# Patient Record
Sex: Female | Born: 1985 | Race: White | Hispanic: No | Smoking: Former smoker
Health system: Southern US, Community
[De-identification: ages and names within clinical notes are randomized; demographics above are authoritative.]

## PROBLEM LIST (undated history)

## (undated) DIAGNOSIS — M1990 Unspecified osteoarthritis, unspecified site: Secondary | ICD-10-CM

## (undated) DIAGNOSIS — E876 Hypokalemia: Secondary | ICD-10-CM

## (undated) DIAGNOSIS — F419 Anxiety disorder, unspecified: Secondary | ICD-10-CM

## (undated) DIAGNOSIS — R87629 Unspecified abnormal cytological findings in specimens from vagina: Secondary | ICD-10-CM

## (undated) DIAGNOSIS — N200 Calculus of kidney: Secondary | ICD-10-CM

## (undated) DIAGNOSIS — N390 Urinary tract infection, site not specified: Secondary | ICD-10-CM

## (undated) HISTORY — PX: INCISION AND DRAINAGE ABSCESS ANAL: SUR669

## (undated) HISTORY — PX: TUBAL LIGATION: SHX77

## (undated) HISTORY — PX: DILATION AND CURETTAGE OF UTERUS: SHX78

## (undated) HISTORY — PX: OTHER SURGICAL HISTORY: SHX169

---
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
## 2005-02-10 ENCOUNTER — Emergency Department (HOSPITAL_COMMUNITY): Admission: EM | Admit: 2005-02-10 | Discharge: 2005-02-10 | Payer: Self-pay | Admitting: Emergency Medicine

## 2005-06-03 ENCOUNTER — Inpatient Hospital Stay (HOSPITAL_COMMUNITY): Admission: AD | Admit: 2005-06-03 | Discharge: 2005-06-03 | Payer: Self-pay | Admitting: *Deleted

## 2005-09-16 ENCOUNTER — Emergency Department (HOSPITAL_COMMUNITY): Admission: EM | Admit: 2005-09-16 | Discharge: 2005-09-16 | Payer: Self-pay | Admitting: Family Medicine

## 2006-01-23 ENCOUNTER — Emergency Department (HOSPITAL_COMMUNITY): Admission: EM | Admit: 2006-01-23 | Discharge: 2006-01-23 | Payer: Self-pay | Admitting: Emergency Medicine

## 2006-02-14 ENCOUNTER — Inpatient Hospital Stay (HOSPITAL_COMMUNITY): Admission: AD | Admit: 2006-02-14 | Discharge: 2006-02-15 | Payer: Self-pay | Admitting: Gynecology

## 2006-02-21 ENCOUNTER — Inpatient Hospital Stay (HOSPITAL_COMMUNITY): Admission: AD | Admit: 2006-02-21 | Discharge: 2006-02-21 | Payer: Self-pay | Admitting: Gynecology

## 2006-03-04 ENCOUNTER — Encounter: Payer: Self-pay | Admitting: Emergency Medicine

## 2006-03-05 ENCOUNTER — Inpatient Hospital Stay (HOSPITAL_COMMUNITY): Admission: AD | Admit: 2006-03-05 | Discharge: 2006-03-06 | Payer: Self-pay | Admitting: Obstetrics & Gynecology

## 2006-03-22 ENCOUNTER — Ambulatory Visit (HOSPITAL_COMMUNITY): Admission: RE | Admit: 2006-03-22 | Discharge: 2006-03-22 | Payer: Self-pay | Admitting: Obstetrics and Gynecology

## 2006-03-22 ENCOUNTER — Encounter (INDEPENDENT_AMBULATORY_CARE_PROVIDER_SITE_OTHER): Payer: Self-pay | Admitting: Specialist

## 2006-03-26 ENCOUNTER — Emergency Department (HOSPITAL_COMMUNITY): Admission: EM | Admit: 2006-03-26 | Discharge: 2006-03-26 | Payer: Self-pay | Admitting: Emergency Medicine

## 2006-07-21 ENCOUNTER — Inpatient Hospital Stay (HOSPITAL_COMMUNITY): Admission: AD | Admit: 2006-07-21 | Discharge: 2006-07-21 | Payer: Self-pay | Admitting: Family Medicine

## 2006-09-04 ENCOUNTER — Inpatient Hospital Stay (HOSPITAL_COMMUNITY): Admission: AD | Admit: 2006-09-04 | Discharge: 2006-09-04 | Payer: Self-pay | Admitting: Gynecology

## 2007-01-08 ENCOUNTER — Ambulatory Visit (HOSPITAL_COMMUNITY): Admission: AD | Admit: 2007-01-08 | Discharge: 2007-01-08 | Payer: Self-pay | Admitting: Obstetrics and Gynecology

## 2007-02-03 ENCOUNTER — Ambulatory Visit: Payer: Self-pay | Admitting: Physician Assistant

## 2007-02-03 ENCOUNTER — Observation Stay (HOSPITAL_COMMUNITY): Admission: AD | Admit: 2007-02-03 | Discharge: 2007-02-03 | Payer: Self-pay | Admitting: Gynecology

## 2007-02-15 ENCOUNTER — Encounter (INDEPENDENT_AMBULATORY_CARE_PROVIDER_SITE_OTHER): Payer: Self-pay | Admitting: Gynecology

## 2007-02-15 ENCOUNTER — Inpatient Hospital Stay (HOSPITAL_COMMUNITY): Admission: AD | Admit: 2007-02-15 | Discharge: 2007-02-17 | Payer: Self-pay | Admitting: Obstetrics and Gynecology

## 2007-02-15 ENCOUNTER — Ambulatory Visit: Payer: Self-pay | Admitting: Gynecology

## 2009-04-17 ENCOUNTER — Inpatient Hospital Stay (HOSPITAL_COMMUNITY): Admission: AD | Admit: 2009-04-17 | Discharge: 2009-04-17 | Payer: Self-pay | Admitting: Obstetrics and Gynecology

## 2010-12-03 LAB — CBC
HCT: 41.9 % (ref 36.0–46.0)
MCHC: 34.2 g/dL (ref 30.0–36.0)
MCV: 92.6 fL (ref 78.0–100.0)
RDW: 12.8 % (ref 11.5–15.5)

## 2010-12-03 LAB — WET PREP, GENITAL: Trich, Wet Prep: NONE SEEN

## 2010-12-03 LAB — POCT PREGNANCY, URINE: Preg Test, Ur: NEGATIVE

## 2010-12-03 LAB — URINALYSIS, ROUTINE W REFLEX MICROSCOPIC
Bilirubin Urine: NEGATIVE
Glucose, UA: NEGATIVE mg/dL
Protein, ur: NEGATIVE mg/dL

## 2010-12-03 LAB — GC/CHLAMYDIA PROBE AMP, GENITAL: Chlamydia, DNA Probe: NEGATIVE

## 2011-01-10 NOTE — Group Therapy Note (Signed)
NAMECHINA, Kaitlyn Kramer               ACCOUNT NO.:  [id number]   MEDICAL RECORD NO.:  [id number]          PATIENT TYPE:  OIB   LOCATION:  A415                          FACILITY:  [facility]   PHYSICIAN:  Lazaro Arms, M.D.   DATE OF BIRTH:  1985-09-17   DATE OF PROCEDURE:  01/08/2007  DATE OF DISCHARGE:  01/08/2007                                 PROGRESS NOTE   Kaitlyn Kramer came in on Jan 08, 2007, with complaints of some abdominal pain  and cramps.  She is 33-1/[redacted] weeks gestation.  She was monitored for about  2-1/2 hours.  She was not having any contractions.  Her cervix was fine  and fetal heart rate was reactive; so she was discharged home in stable  condition.      Jacklyn Shell, C.N.M.      Lazaro Arms, M.D.  Electronically Signed    FC/MEDQ  D:  01/10/2007  T:  01/10/2007  Job:  [id number]   cc:   [facility]

## 2011-01-10 NOTE — Op Note (Signed)
NAMEDOT, SPLINTER               ACCOUNT NO.:  [id number]   MEDICAL RECORD NO.:  [id number]          PATIENT TYPE:  INP   LOCATION:  9143                          FACILITY:  [facility]   PHYSICIAN:  Ginger Carne, MD  DATE OF BIRTH:  07/08/1986   DATE OF PROCEDURE:  02/15/2007  DATE OF DISCHARGE:                               OPERATIVE REPORT   PREOPERATIVE DIAGNOSIS:  Retained placenta immediate post delivery.   POSTOPERATIVE DIAGNOSIS:  Retained placenta immediate post delivery.   PROCEDURES:  1. Manual removal of placenta.  2. Repair of second-degree perineal laceration.   SURGEON:  Ginger Carne, MD   ASSISTANT:  None.   COMPLICATIONS:  None immediate.   ESTIMATED BLOOD LOSS:  Minimal.   ANESTHESIA:  General.   OPERATIVE FINDINGS:  The patient had an hourglass-type uterus with  significant contraction, which entrapped a portion of the placenta,  making it difficult to remove manually in labor and delivery without the  benefit of an epidural and once the patient's uterus had relaxed  secondary to nitroglycerin and general anesthesia, an easy manual  removal was performed.  No remnants of tissue noted within the uterine  cavity.  No evidence for accreta was noted.   OPERATIVE PROCEDURE:  The patient prepped and draped in usual fashion  and placed in the lithotomy position.  Betadine solution used for  antiseptic.  The patient was catheterized prior to procedure.  After  adequate general anesthesia and the use of 50 mcg of nitroglycerin  intravenously, manual removal of the placenta was effected.  Careful  exploration followed.  No evidence of accreta or placental remnants  noted.  Afterwards 0.2 mg of Methergine intramuscularly and IV Pitocin  were administered.  Minimal bleeding noted at the end of the procedure.  Repair of the laceration was with 2-0 Vicryl running suture.  The  patient returned to the post anesthesia recovery room in excellent   condition.      Ginger Carne, MD  Electronically Signed     SHB/MEDQ  D:  02/15/2007  T:  02/15/2007  Job:  [id number]

## 2011-01-13 NOTE — H&P (Signed)
NAME:  Kaitlyn Kramer, Kaitlyn Kramer                         ACCOUNT NO.:  [id number]   MEDICAL RECORD NO.:  [id number]                   PATIENT TYPE:  INP   LOCATION:  A417                                 FACILITY:  [facility]   PHYSICIAN:  Jacklyn Shell, C.N.M.    DATE OF BIRTH:  1986/03/26   DATE OF ADMISSION:  12/20/2003  DATE OF DISCHARGE:                                HISTORY & PHYSICAL   HISTORY:  [name] was noted to be fully dilated at +2 station at approximately  10:40.  After a very brief and effective second stage she delivered a viable  female infant at 11:16.  The mouth and nose were suctioned on the perineum  with a bulb syringe, and a loose nuchal cord was reduced.  The body  delivered without difficulty.  Weight is pending, although it looks to be  about 6.5 to 7 pounds, and Apgar's are 9 and 9.  Pitocin 20 units diluted in  1000 cc of lactated ringers was then infused rapidly IV.  The placenta  separated spontaneously, and was delivered by a controlled cord traction and  maternal pushing effort at 11:19.  It was inspected and appears to be intact  with a three vessel cord.  The vagina was inspected and only a small right  labia minora tear was noted and that was repaired under epidural anesthesia  with one stitch just to bring the edges together.  Estimated blood loss was  200 cc.  The epidural catheter was then removed with the blue tip visualized  as being intact.     ___________________________________________                                         Jacklyn Shell, C.N.M.   FC/MEDQ  D:  12/21/2003  T:  12/21/2003  Job:  [id number]   cc:   [facility]

## 2011-01-13 NOTE — H&P (Signed)
Kaitlyn Kramer, Kaitlyn Kramer               ACCOUNT NO.:  [id number]   MEDICAL RECORD NO.:  [id number]          PATIENT TYPE:  AMB   LOCATION:  DAY                           FACILITY:  [facility]   PHYSICIAN:  Tilda Burrow, M.D. DATE OF BIRTH:  [birth year]   DATE OF ADMISSION:  DATE OF DISCHARGE:  LH                                HISTORY & PHYSICAL   ADMISSION DIAGNOSIS:  1.  Missed abortion, [redacted] weeks gestation.  2.  Vulvar abscess, chronic non-healing.  3.  Perineal condyloma.   HISTORY OF PRESENT ILLNESS:  This 25 year old female, gravida 3, para 1, AB  1, with one prior child, is admitted at this time for a vulvar abscess.  She  is admitted at this time for suction D&C for miscarriage diagnosed in our  office.  She was [redacted] weeks gestation when ultrasound showed a 3 mm crown rump  length with absence of fetal heart motion.  She has a large gestational sac  with missed AB explained to patient.  Additionally, she has a non-healing  cystic structure in the left labia minora which appears to be an old  sebaceous cyst which is chronic and non-healing in nature.  She requests  that this be excised at the time.  This dates back as far as six months and  was drained once unsuccessfully.  Additionally, she has multiple condylomata  of the posterior fourchette and a couple at the perianal area.  The plans  are to fulgurate these as part of the procedure.  She understands this  procedure.   PAST MEDICAL HISTORY:  Benign. Impulsive personality.  Migraine headaches.   PAST SURGICAL HISTORY:  Negative other than D&C x1.   MEDICATIONS:  Birth control pills, not recently.   FAMILY HISTORY:  Positive for hypertension, diabetes, breast cancer, and  epilepsy, as well as anxiety.   SOCIAL HISTORY:  Cigarettes rare.  Alcohol denied.  Marijuana denied.   PHYSICAL EXAMINATION:  VITAL SIGNS:  Height 5 foot 7 inches, weight 201, blood pressure 106/68,  pulse 70.  HEENT:  Pupils equal, round, and reactive.  NECK:  Supple, normal thyroid.  CHEST:  Clear to auscultation.  BREASTS:  Exam deferred.  CARDIAC:  Regular rate and rhythm.  ABDOMEN:  Obese without masses or herniae.  PELVIC:  External genitalia multiparous. Vaginal exam normal secretions of  heavy intensity.  Uterus anteflexed,  normal size, shape, and contour with exam made more difficult with obesity.  Perineum is abnormal with the right vulvar abscess, 1.5 cm in diameter by 1  cm depth.  Adnexa nontender without masses.  Uterus anteflexed.   PLAN:  Suction D&C March 22, 2006, with excision of vulvar abscess and  fulguration of condyloma at the same time.      Tilda Burrow, M.D.  Electronically Signed     JVF/MEDQ  D:  03/20/2006  T:  03/20/2006  Job:  [id number]

## 2011-01-13 NOTE — Op Note (Signed)
NAMESEAIRA, BYUS               ACCOUNT NO.:  [id number]   MEDICAL RECORD NO.:  [id number]          PATIENT TYPE:  AMB   LOCATION:  DAY                           FACILITY:  [facility]   PHYSICIAN:  Tilda Burrow, M.D. DATE OF BIRTH:  1985/10/02   DATE OF PROCEDURE:  03/22/2006  DATE OF DISCHARGE:                                 OPERATIVE REPORT   PREOPERATIVE DIAGNOSES:  1.  Missed abortion.  2.  Vulvar abscess.  3.  Vulvar condyloma.  4.  Perianal condyloma.   POSTOPERATIVE DIAGNOSES:  1.  Missed abortion.  2.  Bartholin's abscess.  3.  Vulvar condyloma.  4.  Perianal condyloma.   PROCEDURES:  1.  Suction D&C.  2.  Excision of right Bartholin's abscess.  3.  Fulguration perianal condyloma.  4.  Fulguration perineal condyloma.   SURGEON:  Dr. Emelda Fear.   ASSISTANT:  None.   ANESTHESIA:  General.   COMPLICATIONS:  None.   FINDINGS:  1.  Uterus sounding to 14 cm pre-procedure, 13 cm post-procedure.  2.  Large Bartholin's cyst, extending well up the vaginal wall on the right      side.  3.  Condyloma inside the anal verge, as well as outside of it, multiple      tiny, 5 mm condyloma.   DETAILS OF PROCEDURE:  Patient was taken to the operating room and prepped  and draped for vaginal procedure.  Speculum was inserted.  Cervix grasped.  Uterus sounded to 14 cm, dilated to 31-French, with 9-mm suction curette  used to perform suction curettage.  There was generous amount of bleeding.  IV oxytocin was initiated.  We used a larger suction curette, 10-mm, and  continued curetting and then used smooth sharp curettage to satisfactorily  evacuation the endometrium.  At the end there was a somewhat gritty feel  obtained.  The uterus was diffusely enlarged, however.  At no time was there  suspicion of perforation.   Next was to address the vulvar abscess, which turned out to be a Bartholin's  abscess which had been drained through the perineal body.  An elliptical  incision  was made on the right labia majora, at 8 o'clock position around  the introitus.  The draining abscess site was circumscribed and carved out,  extending approximately 4-5 cm up the vaginal wall.  We did not enter into  the vaginal wall, but reached the base of the abscess, opened it enough to  be sure it would remove the entire abscess, then trimmed it out with  hemostats placed across the vascular pedicles at its apex.  These were tied  down with 2-0 chromic.  Perineal body was then rebuilt with interrupted and  continuous 2-0 chromic.  The skin edges were reapproximated with 3-0 chromic  subcuticular placement.   Vulvar and perianal fulguration of warts was then performed, using cautery  attached to a suction filter and with everybody in the room using  appropriate antiviral masks, we proceeded with fulguration of the perineal  condyloma and the perianal condyloma.  The perianal condyloma could be  palpated inside  the anal verge, and so the tissues were exposed using  traction and countertraction, and cauterization performed superficially to  eliminate the condyloma, both inside the anus as well as over the areas of  the posterior perineal body.  Clitoral area was inspected and no condyloma  noted.  The vaginal area was similarly negative.      Tilda Burrow, M.D.  Electronically Signed     JVF/MEDQ  D:  03/22/2006  T:  03/22/2006  Job:  [id number]   cc:   [facility]

## 2011-01-13 NOTE — H&P (Signed)
NAME:  Kaitlyn Kramer, Kaitlyn Kramer                         ACCOUNT NO.:  [id number]   MEDICAL RECORD NO.:  [id number]                  PATIENT TYPE:   LOCATION:                                       FACILITY:  [facility]   PHYSICIAN:  Tilda Burrow, M.D.              DATE OF BIRTH:  [birth year]   DATE OF ADMISSION:  12/20/2003  DATE OF DISCHARGE:                                HISTORY & PHYSICAL   ADMITTING DIAGNOSES:  1. Pregnancy [redacted] weeks gestation.  2. Elective induction of labor.  3. Cervical favorability.   HPI:  This 25 year old female gravida 1, para 0, LMP May 19, 2003  __________ December 24, 2003 with first and third trimester ultrasounds roughly  correlating.  She is admitted after pregnancy course followed through our  office.  The cervix was 1-2 cm, 50%, -1 with a posteriorly oriented cervix  on her last two office visits.  She has had prodromal symptoms over the past  few days.  She is scheduled for balloon cervical ripening on the 24th and  delivery on the 25th.  She requests epidural for labor management, will use  Dr. Milford Cage, plans to have the baby circumcised.  Will breast feed and bottle  supplement.   PAST MEDICAL HISTORY:  Occasional migraines.   SURGICAL HISTORY:  Negative.   ALLERGIES:  None.   SOCIAL HISTORY:  A 9th grade education.  Plans for GED.  Unemployed.   PHYSICAL EXAMINATION:  Height 5 feet 7 inches; weight 234, which is a 38  pound weight gain; fundal height 38 cm.  Estimated fetal weight 8 pounds.  CERVIX:  Is 1-2 cm, 50%, posterior, -1 station, vertex presentation with  very soft cervix.   Blood type O positive, urine drug screen negative, Rubella immunity present.  Hemoglobin 12, hematocrit 36.  Hepatitis, HIV, GC, Chlamydia, RPR are all  negative.   PLAN:  Balloon dilation on the 24th, Pitocin induction on the 25th.     ___________________________________________                                         Tilda Burrow, M.D.   JVF/MEDQ  D:   12/15/2003  T:  12/15/2003  Job:  [id number]   cc:   Francoise Schaumann. Halm, D.O.  [address]  [place]  [place] [address]  Fax: [phone]

## 2011-01-13 NOTE — Op Note (Signed)
NAME:  Kaitlyn Kramer, BRADT                         ACCOUNT NO.:  [id number]   MEDICAL RECORD NO.:  [id number]                   PATIENT TYPE:  INP   LOCATION:  A403                                 FACILITY:  [facility]   PHYSICIAN:  Lazaro Arms, M.D.                DATE OF BIRTH:  1986-05-08   DATE OF PROCEDURE:  12/21/2003  DATE OF DISCHARGE:  12/23/2003                                 OPERATIVE REPORT   PROCEDURE:  Epidural placement.   INDICATIONS FOR PROCEDURE:  Alanii is an 25 year old gravida 1, para 0 at  term being induced who is having regular contractions and requesting an  epidural to be placed.   DESCRIPTION OF PROCEDURE:  She is placed in the sitting position.  Betadine  prep is used.  Field drape is placed.  A 17-gauge Tuohy needle is used with  loss of resistance technique.  The epidural space is found with one pass  without difficulty.  Bupivacaine 0.125%, 10 cc, is given as a test dose with  an additional 10 cc to dose it up after the catheter is threaded 5 cm into  the epidural space.  It is taped down at this point.  The continuous  infusion is placed at 12 cc an hour.  The patient tolerated the procedure  well.  She had a reassuring fetal heart rate tracing, and her blood pressure  was stable.      ___________________________________________                                            Lazaro Arms, M.D.   LHE/MEDQ  D:  01/05/2004  T:  01/05/2004  Job:  [id number]

## 2011-06-14 LAB — CBC
HCT: 27.6 — ABNORMAL LOW
HCT: 34.6 — ABNORMAL LOW
Hemoglobin: 9.3 — ABNORMAL LOW
MCHC: 33.8
MCHC: 35.1
MCV: 89.6
MCV: 91
Platelets: 177
Platelets: 205
RBC: 3.04 — ABNORMAL LOW
RDW: 13.1
RDW: 13.4
WBC: 10.5
WBC: 15.2 — ABNORMAL HIGH

## 2011-06-14 LAB — TYPE AND SCREEN: Antibody Screen: NEGATIVE

## 2011-06-15 LAB — URINALYSIS, ROUTINE W REFLEX MICROSCOPIC: Ketones, ur: NEGATIVE

## 2011-06-15 LAB — CBC
HCT: 36.3
Hemoglobin: 12.4
MCHC: 34.1
MCV: 89
Platelets: 234
RBC: 4.08
RDW: 13

## 2011-06-15 LAB — RPR: RPR Ser Ql: NONREACTIVE

## 2012-12-01 ENCOUNTER — Encounter (HOSPITAL_COMMUNITY): Payer: Self-pay | Admitting: Emergency Medicine

## 2012-12-01 ENCOUNTER — Emergency Department (HOSPITAL_COMMUNITY)
Admission: EM | Admit: 2012-12-01 | Discharge: 2012-12-02 | Disposition: A | Discharge status: Home / Self Care | Payer: Medicaid Other | Attending: Emergency Medicine | Admitting: Emergency Medicine

## 2012-12-01 DIAGNOSIS — Z79899 Other long term (current) drug therapy: Secondary | ICD-10-CM | POA: Insufficient documentation

## 2012-12-01 DIAGNOSIS — F17200 Nicotine dependence, unspecified, uncomplicated: Secondary | ICD-10-CM | POA: Insufficient documentation

## 2012-12-01 DIAGNOSIS — M129 Arthropathy, unspecified: Secondary | ICD-10-CM | POA: Insufficient documentation

## 2012-12-01 DIAGNOSIS — M26609 Unspecified temporomandibular joint disorder, unspecified side: Secondary | ICD-10-CM | POA: Insufficient documentation

## 2012-12-01 DIAGNOSIS — M26629 Arthralgia of temporomandibular joint, unspecified side: Secondary | ICD-10-CM

## 2012-12-01 HISTORY — DX: Unspecified osteoarthritis, unspecified site: M19.90

## 2012-12-01 MED ORDER — HYDROCODONE-ACETAMINOPHEN 5-325 MG PO TABS
1.0000 | ORAL_TABLET | Freq: Once | ORAL | Status: AC
  Administered 2012-12-01: 1 via ORAL
  Filled 2012-12-01: qty 1

## 2012-12-01 MED ORDER — HYDROCODONE-ACETAMINOPHEN 5-325 MG PO TABS: 1.0000 | ORAL_TABLET | Freq: Four times a day (QID) | ORAL | Status: DC | PRN

## 2012-12-01 MED ORDER — IBUPROFEN 600 MG PO TABS: 600.0000 mg | ORAL_TABLET | Freq: Four times a day (QID) | ORAL | Status: DC | PRN

## 2012-12-01 MED ORDER — IBUPROFEN 400 MG PO TABS
600.0000 mg | ORAL_TABLET | Freq: Once | ORAL | Status: AC
  Administered 2012-12-01: 600 mg via ORAL
  Filled 2012-12-01: qty 1

## 2012-12-01 NOTE — ED Notes (Signed)
Patient said three days ago she started having jaw pain. The patient said it started Friday and it started going away Saturday.  Now, the patient said she cannot eat because she is having pressure and pain to her neck, and ear from her "locked" jaw.  She says she can pop it back in place but it is still painful and she feels the pressure.  Her doctor told her it was fine unless she was experiencing pain.

## 2012-12-01 NOTE — ED Notes (Signed)
C/o R jaw pain since 11am.  States jaw often "pops." States unable to open mouth wide.

## 2012-12-01 NOTE — ED Provider Notes (Signed)
History     CSN: [id number]  Arrival date & time 12/01/12  2228   First MD Initiated Contact with Patient 12/01/12 2319      Chief Complaint  Patient presents with  . Jaw Pain    (Consider location/radiation/quality/duration/timing/severity/associated sxs/prior treatment) HPI Comments: Patient has "click" right side with opening mouth   Has ben going on for a while today took Ibuprofen and tyenol for discomfort without relief  teeth are meeting normally  Denies trauma   The history is provided by the patient.    Past Medical History  Diagnosis Date  . Arthritis     Past Surgical History  Procedure Laterality Date  . Dilation and curettage of uterus    . Tubal ligation    . Incision and drainage abscess anal      No family history on file.  History  Substance Use Topics  . Smoking status: Current Every Day Smoker  . Smokeless tobacco: Not on file  . Alcohol Use: No    OB History   Grav Para Term Preterm Abortions TAB SAB Ect Mult Living                  Review of Systems  Constitutional: Negative for fever, chills, activity change and unexpected weight change.  HENT: Negative for hearing loss, ear pain, sore throat, facial swelling, rhinorrhea and trouble swallowing.   All other systems reviewed and are negative.    Allergies  Review of patient's allergies indicates no known allergies.  Home Medications   Current Outpatient Rx  Name  Route  Sig  Dispense  Refill  . acetaminophen (TYLENOL) 500 MG tablet   Oral   Take 1,000 mg by mouth every 6 (six) hours as needed for pain.         . Aspirin-Acetaminophen-Caffeine (GOODY HEADACHE PO)   Oral   Take 1 packet by mouth daily as needed.         Marland Kitchen ibuprofen (ADVIL,MOTRIN) 200 MG tablet   Oral   Take 400 mg by mouth every 6 (six) hours as needed for pain.         Marland Kitchen loratadine (CLARITIN) 10 MG tablet   Oral   Take 10 mg by mouth daily.         . naproxen sodium (ANAPROX) 220 MG tablet    Oral   Take 220 mg by mouth 2 (two) times daily with a meal.         . phentermine 37.5 MG capsule   Oral   Take 37.5 mg by mouth every morning.         Marland Kitchen HYDROcodone-acetaminophen (NORCO/VICODIN) 5-325 MG per tablet   Oral   Take 1 tablet by mouth every 6 (six) hours as needed for pain.   12 tablet   0   . ibuprofen (ADVIL,MOTRIN) 600 MG tablet   Oral   Take 1 tablet (600 mg total) by mouth every 6 (six) hours as needed for pain.   30 tablet   0     BP 152/111  Pulse 91  Temp(Src) 98.5 F (36.9 C) (Oral)  Resp 16  SpO2 100%  LMP 11/25/2012  Physical Exam  Constitutional: She is oriented to person, place, and time. She appears well-developed and well-nourished.  HENT:  Head: Normocephalic and atraumatic.  Mouth/Throat: Uvula is midline.  Patient has click R TMJ   Eyes: Pupils are equal, round, and reactive to light.  Neck: Normal range of motion. Neck supple.  Cardiovascular: Normal rate.   Pulmonary/Chest: Effort normal.  Neurological: She is alert and oriented to person, place, and time.  Skin: Skin is warm and dry.    ED Course  Procedures (including critical care time)  Labs Reviewed - No data to display No results found.   1. TMJ pain dysfunction syndrome       MDM  TMJ dysfunction will referr to ENT       Arman Filter, NP 12/01/12 2340

## 2012-12-02 NOTE — ED Notes (Signed)
Patient is alert and orientedx4.  Patient was explained discharge instructions and they understood them with no questions.  The patient's friend, Christene Slates, is taking the patient home.

## 2012-12-02 NOTE — ED Provider Notes (Signed)
Medical screening examination/treatment/procedure(s) were performed by non-physician practitioner and as supervising physician I was immediately available for consultation/collaboration.  Kaimana Lurz L Durrel Mcnee, MD 12/02/12 0526 

## 2013-02-14 ENCOUNTER — Encounter (HOSPITAL_COMMUNITY): Payer: Self-pay | Admitting: *Deleted

## 2013-02-14 ENCOUNTER — Emergency Department (HOSPITAL_COMMUNITY)
Admission: EM | Admit: 2013-02-14 | Discharge: 2013-02-15 | Disposition: A | Discharge status: Home / Self Care | Payer: Medicaid Other | Attending: Emergency Medicine | Admitting: Emergency Medicine

## 2013-02-14 DIAGNOSIS — N390 Urinary tract infection, site not specified: Secondary | ICD-10-CM | POA: Insufficient documentation

## 2013-02-14 DIAGNOSIS — Z872 Personal history of diseases of the skin and subcutaneous tissue: Secondary | ICD-10-CM | POA: Insufficient documentation

## 2013-02-14 DIAGNOSIS — R112 Nausea with vomiting, unspecified: Secondary | ICD-10-CM | POA: Insufficient documentation

## 2013-02-14 DIAGNOSIS — K011 Impacted teeth: Secondary | ICD-10-CM

## 2013-02-14 DIAGNOSIS — F17200 Nicotine dependence, unspecified, uncomplicated: Secondary | ICD-10-CM | POA: Insufficient documentation

## 2013-02-14 DIAGNOSIS — K006 Disturbances in tooth eruption: Secondary | ICD-10-CM | POA: Insufficient documentation

## 2013-02-14 DIAGNOSIS — M1990 Unspecified osteoarthritis, unspecified site: Secondary | ICD-10-CM | POA: Insufficient documentation

## 2013-02-14 DIAGNOSIS — R509 Fever, unspecified: Secondary | ICD-10-CM

## 2013-02-14 LAB — CBC WITH DIFFERENTIAL/PLATELET
Basophils Relative: 0 % (ref 0–1)
Eosinophils Relative: 0 % (ref 0–5)
HCT: 41.8 % (ref 36.0–46.0)
Hemoglobin: 14.7 g/dL (ref 12.0–15.0)
Lymphocytes Relative: 15 % (ref 12–46)
Monocytes Relative: 10 % (ref 3–12)
Neutro Abs: 7.9 10*3/uL — ABNORMAL HIGH (ref 1.7–7.7)
WBC: 10.6 10*3/uL — ABNORMAL HIGH (ref 4.0–10.5)

## 2013-02-14 LAB — URINALYSIS, ROUTINE W REFLEX MICROSCOPIC
Bilirubin Urine: NEGATIVE
Glucose, UA: NEGATIVE mg/dL
Ketones, ur: NEGATIVE mg/dL
Nitrite: POSITIVE — AB
Protein, ur: 100 mg/dL — AB
Specific Gravity, Urine: 1.02 (ref 1.005–1.030)
Urobilinogen, UA: 2 mg/dL — ABNORMAL HIGH (ref 0.0–1.0)
pH: 7 (ref 5.0–8.0)

## 2013-02-14 LAB — COMPREHENSIVE METABOLIC PANEL
Alkaline Phosphatase: 104 U/L (ref 39–117)
BUN: 6 mg/dL (ref 6–23)
Chloride: 102 mEq/L (ref 96–112)
GFR calc Af Amer: >90 mL/min (ref >90)
Glucose, Bld: 96 mg/dL (ref 70–99)
Potassium: 3.7 mEq/L (ref 3.5–5.1)
Total Bilirubin: 0.4 mg/dL (ref 0.3–1.2)

## 2013-02-14 LAB — URINE MICROSCOPIC-ADD ON

## 2013-02-14 LAB — PREGNANCY, URINE: Preg Test, Ur: NEGATIVE

## 2013-02-14 LAB — RAPID STREP SCREEN (MED CTR MEBANE ONLY): Streptococcus, Group A Screen (Direct): NEGATIVE

## 2013-02-14 MED ORDER — ONDANSETRON 8 MG PO TBDP: 8.0000 mg | ORAL_TABLET | Freq: Once | ORAL | Status: DC

## 2013-02-14 MED ORDER — CIPROFLOXACIN HCL 500 MG PO TABS
500.0000 mg | ORAL_TABLET | Freq: Two times a day (BID) | ORAL | Status: DC
  Administered 2013-02-14: 500 mg via ORAL
  Filled 2013-02-14: qty 1

## 2013-02-14 MED ORDER — CIPROFLOXACIN HCL 500 MG PO TABS: 500.0000 mg | ORAL_TABLET | Freq: Two times a day (BID) | ORAL | Status: DC

## 2013-02-14 MED ORDER — ACETAMINOPHEN 325 MG PO TABS
650.0000 mg | ORAL_TABLET | Freq: Once | ORAL | Status: AC
  Administered 2013-02-14: 650 mg via ORAL
  Filled 2013-02-14: qty 2

## 2013-02-14 MED ORDER — HYDROCODONE-ACETAMINOPHEN 5-325 MG PO TABS: 2.0000 | ORAL_TABLET | ORAL | Status: DC | PRN

## 2013-02-14 MED ORDER — IBUPROFEN 800 MG PO TABS: 800.0000 mg | ORAL_TABLET | Freq: Three times a day (TID) | ORAL | Status: DC

## 2013-02-14 MED ORDER — ONDANSETRON 4 MG PO TBDP
8.0000 mg | ORAL_TABLET | Freq: Once | ORAL | Status: AC
  Administered 2013-02-14: 8 mg via ORAL
  Filled 2013-02-14: qty 2

## 2013-02-14 NOTE — ED Notes (Signed)
The pt has had a sorehtroat body aches temp  n v for one week. With a toothahce.  lmp today

## 2013-02-15 NOTE — ED Provider Notes (Signed)
History     CSN: [id number]  Arrival date & time 02/14/13  2233   First MD Initiated Contact with Patient 02/14/13 2257      Chief Complaint  Patient presents with  . multiple complaints     (Consider location/radiation/quality/duration/timing/severity/associated sxs/prior treatment) HPI 27-year-old female presents to emergency room with complaint of one day of myalgias, nausea, vomiting, fever and chills, and tooth pain.  Patient told triage it been going on for week, but she and her friend now feel it has only gone on for one day.  She reports dysuria, and back pain, as well.  Patient started her period today.  No treatment prior to arrival Past Medical History  Diagnosis Date  . Arthritis     Past Surgical History  Procedure Laterality Date  . Dilation and curettage of uterus    . Tubal ligation    . Incision and drainage abscess anal      No family history on file.  History  Substance Use Topics  . Smoking status: Current Every Day Smoker  . Smokeless tobacco: Not on file  . Alcohol Use: No    OB History   Grav Para Term Preterm Abortions TAB SAB Ect Mult Living                  Review of Systems  All other systems reviewed and are negative.    Allergies  Review of patient's allergies indicates no known allergies.  Home Medications   Current Outpatient Rx  Name  Route  Sig  Dispense  Refill  . Aspirin-Acetaminophen-Caffeine (GOODY HEADACHE PO)   Oral   Take 1 packet by mouth daily as needed (for pain).          . Ibuprofen (MIDOL) 200 MG CAPS   Oral   Take 200 mg by mouth every 8 (eight) hours as needed (for pain).         . ciprofloxacin (CIPRO) 500 MG tablet   Oral   Take 1 tablet (500 mg total) by mouth 2 (two) times daily.   14 tablet   0   . HYDROcodone-acetaminophen (NORCO/VICODIN) 5-325 MG per tablet   Oral   Take 2 tablets by mouth every 4 (four) hours as needed for pain.   10 tablet   0   . ibuprofen (ADVIL,MOTRIN) 800 MG  tablet   Oral   Take 1 tablet (800 mg total) by mouth 3 (three) times daily.   21 tablet   0   . ondansetron (ZOFRAN-ODT) 8 MG disintegrating tablet   Oral   Take 1 tablet (8 mg total) by mouth once.   20 tablet   0     BP 129/79  Pulse 118  Temp(Src) 102.2 F (39 C) (Oral)  Resp 18  SpO2 99%  LMP 02/14/2013  Physical Exam  Nursing note and vitals reviewed. Constitutional: She is oriented to person, place, and time. She appears well-developed and well-nourished. She appears distressed (uncomfortable-appearing).  HENT:  Head: Normocephalic and atraumatic.  Right Ear: External ear normal.  Left Ear: External ear normal.  Nose: Nose normal.  Mouth/Throat: Oropharynx is clear and moist.  Mild erythema to posterior pharynx.  She has cryptic tonsils.  No exudate noted.  Patient with a merging with some teeth on her lower jaw.  No signs of abscess, erythema.  Eyes: Conjunctivae and EOM are normal. Pupils are equal, round, and reactive to light.  Neck: Normal range of motion. Neck supple. No JVD present.  No tracheal deviation present. No thyromegaly present.  No nuchal rigidity  Cardiovascular: Regular rhythm, normal heart sounds and intact distal pulses.  Exam reveals no gallop and no friction rub.   No murmur heard. Tachycardia noted  Pulmonary/Chest: Effort normal and breath sounds normal. No stridor. No respiratory distress. She has no wheezes. She has no rales. She exhibits no tenderness.  Abdominal: Soft. Bowel sounds are normal. She exhibits no distension and no mass. There is tenderness (mild suprapubic tenderness). There is no rebound and no guarding.  Musculoskeletal: Normal range of motion. She exhibits no edema and no tenderness.  Lymphadenopathy:    She has no cervical adenopathy.  Neurological: She is alert and oriented to person, place, and time. She exhibits normal muscle tone. Coordination normal.  Skin: Skin is warm and dry. No rash noted. No erythema. No pallor.   Psychiatric: She has a normal mood and affect. Her behavior is normal. Judgment and thought content normal.    ED Course  Procedures (including critical care time)  Labs Reviewed  CBC WITH DIFFERENTIAL - Abnormal; Notable for the following:    WBC 10.6 (*)    Neutro Abs 7.9 (*)    Monocytes Absolute 1.1 (*)    All other components within normal limits  COMPREHENSIVE METABOLIC PANEL - Abnormal; Notable for the following:    ALT 79 (*)    All other components within normal limits  URINALYSIS, ROUTINE W REFLEX MICROSCOPIC - Abnormal; Notable for the following:    Color, Urine AMBER (*)    APPearance TURBID (*)    Hgb urine dipstick MODERATE (*)    Protein, ur 100 (*)    Urobilinogen, UA 2.0 (*)    Nitrite POSITIVE (*)    Leukocytes, UA LARGE (*)    All other components within normal limits  URINE MICROSCOPIC-ADD ON - Abnormal; Notable for the following:    Squamous Epithelial / LPF FEW (*)    Bacteria, UA MANY (*)    All other components within normal limits  RAPID STREP SCREEN  URINE CULTURE  CULTURE, GROUP A STREP  PREGNANCY, URINE   No results found.   1. Fever   2. Urinary tract infection   3. Impacted tooth       MDM  27 year old female with fever, urinary tract infection, and wisdom teeth are coming in.  No signs of active infection in the mouth.  Will treat with Cipro for her urinary tract infection.  Patient instructed to drink plenty of fluids and alternate Tylenol and Motrin for fevers.  I've given her followup information for local dentist.        Olivia Mackie, MD 02/15/13 [phone]

## 2013-02-16 ENCOUNTER — Encounter (HOSPITAL_COMMUNITY): Payer: Self-pay | Admitting: Emergency Medicine

## 2013-02-16 ENCOUNTER — Emergency Department (HOSPITAL_COMMUNITY)
Admission: EM | Admit: 2013-02-16 | Discharge: 2013-02-16 | Disposition: A | Discharge status: Home / Self Care | Payer: Medicaid Other | Attending: Emergency Medicine | Admitting: Emergency Medicine

## 2013-02-16 DIAGNOSIS — R509 Fever, unspecified: Secondary | ICD-10-CM | POA: Insufficient documentation

## 2013-02-16 DIAGNOSIS — N390 Urinary tract infection, site not specified: Secondary | ICD-10-CM | POA: Insufficient documentation

## 2013-02-16 DIAGNOSIS — Z79899 Other long term (current) drug therapy: Secondary | ICD-10-CM | POA: Insufficient documentation

## 2013-02-16 DIAGNOSIS — J029 Acute pharyngitis, unspecified: Secondary | ICD-10-CM | POA: Insufficient documentation

## 2013-02-16 DIAGNOSIS — F17200 Nicotine dependence, unspecified, uncomplicated: Secondary | ICD-10-CM | POA: Insufficient documentation

## 2013-02-16 DIAGNOSIS — Z8739 Personal history of other diseases of the musculoskeletal system and connective tissue: Secondary | ICD-10-CM | POA: Insufficient documentation

## 2013-02-16 LAB — CULTURE, GROUP A STREP

## 2013-02-16 NOTE — ED Provider Notes (Signed)
History  This chart was scribed for Shelda Jakes, MD by Ardelia Mems, ED Scribe. This patient was seen in room TR05C/TR05C and the patient's care was started at 6:01 PM.   CSN: [id number]  Arrival date & time 02/16/13  1735      Chief Complaint  Patient presents with  . Sore Throat     The history is provided by the patient. No language interpreter was used.    HPI Comments: Kaitlyn Kramer is a 27 y.o. female who presents to the Emergency Department complaining of constant, moderate sore throat onset about 6 days ago, with an associated fever as high as 1048F. Triage Temp is 99.48F. Pt states that it hurts for her to swallow. Pt was in ED 2 days ago for similar symptoms, was diagnosed with UTI and an impacted tooth. And was given clindamycin and hydrocodone. Pt has taken 3/7 doses of her Clindamycin course. Pt had a strep test 2 days ago which resulted negative. Pt states that she has painful abscesses on her teeth and in her throat, but insists that this is not what she is here for today. Pt is a current every day smoker. Pt denies vomiting, diarrhea, abdominal pain, rash or any other symptoms.  PCP- None  Past Medical History  Diagnosis Date  . Arthritis     Past Surgical History  Procedure Laterality Date  . Dilation and curettage of uterus    . Tubal ligation    . Incision and drainage abscess anal      No family history on file.  History  Substance Use Topics  . Smoking status: Current Every Day Smoker  . Smokeless tobacco: Not on file  . Alcohol Use: No    OB History   Grav Para Term Preterm Abortions TAB SAB Ect Mult Living                  Review of Systems  Constitutional: Positive for fever. Negative for diaphoresis.  HENT: Positive for sore throat. Negative for neck pain and neck stiffness.   Eyes: Negative for visual disturbance.  Respiratory: Negative for apnea, chest tightness and shortness of breath.   Cardiovascular: Negative for chest pain and  palpitations.  Gastrointestinal: Negative for nausea, vomiting, abdominal pain, diarrhea and constipation.  Genitourinary: Negative for dysuria.  Musculoskeletal: Negative for gait problem.  Skin: Negative for rash.  Neurological: Negative for dizziness, weakness, light-headedness, numbness and headaches.   A complete 10 system review of systems was obtained and all systems are negative except as noted in the HPI and PMH.   Allergies  Review of patient's allergies indicates no known allergies.  Home Medications   Current Outpatient Rx  Name  Route  Sig  Dispense  Refill  . Aspirin-Acetaminophen-Caffeine (GOODY HEADACHE PO)   Oral   Take 1 packet by mouth daily as needed (for pain).          . ciprofloxacin (CIPRO) 500 MG tablet   Oral   Take 1 tablet (500 mg total) by mouth 2 (two) times daily.   14 tablet   0   . HYDROcodone-acetaminophen (NORCO/VICODIN) 5-325 MG per tablet   Oral   Take 2 tablets by mouth every 4 (four) hours as needed for pain.   10 tablet   0   . ibuprofen (ADVIL,MOTRIN) 800 MG tablet   Oral   Take 1 tablet (800 mg total) by mouth 3 (three) times daily.   21 tablet   0   .  Ibuprofen (MIDOL) 200 MG CAPS   Oral   Take 200 mg by mouth every 8 (eight) hours as needed (for pain).         . ondansetron (ZOFRAN-ODT) 8 MG disintegrating tablet   Oral   Take 1 tablet (8 mg total) by mouth once.   20 tablet   0     Triage Vitals: BP 131/85  Pulse 98  Temp(Src) 99.2 F (37.3 C) (Oral)  SpO2 99%  LMP 02/14/2013  Physical Exam  Nursing note and vitals reviewed. Constitutional: She is oriented to person, place, and time. She appears well-developed and well-nourished. No distress.  HENT:  Head: Normocephalic and atraumatic. No trismus in the jaw.  Mouth/Throat: No dental abscesses. Posterior oropharyngeal edema and posterior oropharyngeal erythema present. No oropharyngeal exudate or tonsillar abscesses.  Oropharynx is clear. Teeth appear  carious. Erythema and edema of the underlying gingiva. Multiple other teeth have grounds or are previously extracted. Neck is nontender and supple without adenopathy or JVD.  Eyes: Conjunctivae and EOM are normal.  Neck: Normal range of motion. Neck supple.  No meningeal signs  Cardiovascular: Normal rate, regular rhythm and normal heart sounds.  Exam reveals no gallop and no friction rub.   No murmur heard. Pulmonary/Chest: Effort normal and breath sounds normal. No respiratory distress. She has no wheezes. She has no rales. She exhibits no tenderness.  Abdominal: Soft. Bowel sounds are normal. She exhibits no distension. There is no tenderness. There is no rebound and no guarding.  Musculoskeletal: Normal range of motion. She exhibits no edema and no tenderness.  Neurological: She is alert and oriented to person, place, and time. No cranial nerve deficit.  Skin: Skin is warm and dry. She is not diaphoretic. No erythema.  Psychiatric: She has a normal mood and affect.    ED Course  Procedures (including critical care time)  DIAGNOSTIC STUDIES: Oxygen Saturation is 99% on RA, normal by my interpretation.    COORDINATION OF CARE: 6:10 PM- Pt advised of plan for treatment and pt agrees.     Labs Reviewed - No data to display No results found.   1. Viral pharyngitis       MDM  Pt afebrile without tonsillar exudate, negative strep. No cervical lymphadenopathy, no dysphagia; diagnosis of viral pharyngitis. No abx indicated. DC w symptomatic tx for pain  Pt does not appear dehydrated, but did discuss importance of water rehydration. Pt did mention that she was concerned for an abscess in her throat. There is no clinical evidence to support this on physical exam. Pt was able to swallow, drink water in ED with no compromise of airway. No signs of PTA or soft tissue infxn.   No trismus or uvula deviation. Discussed with pt a CT scan of her neck to rule this out. Pt considered, then  declined and requested ENT referral. Included this information with her discharge paperwork along with dental resource guide she states she did not get at her earlier visit. Discussed reasons to seek immediate care. Patient expresses understanding and agrees with plan.   Glade Nurse, PA-C 02/16/13 2200

## 2013-02-16 NOTE — ED Notes (Signed)
Pt here 2 days ago for sore throat. Returns today because "I feel like something's in my throat'. Right tonsil swollen. Temp 99.2

## 2013-02-17 ENCOUNTER — Telehealth (HOSPITAL_COMMUNITY): Payer: Self-pay | Admitting: Emergency Medicine

## 2013-02-17 LAB — URINE CULTURE

## 2013-02-18 ENCOUNTER — Emergency Department (HOSPITAL_COMMUNITY)
Admission: EM | Admit: 2013-02-18 | Discharge: 2013-02-19 | Disposition: A | Discharge status: Home / Self Care | Payer: Medicaid Other | Attending: Emergency Medicine | Admitting: Emergency Medicine

## 2013-02-18 ENCOUNTER — Encounter (HOSPITAL_COMMUNITY): Payer: Self-pay | Admitting: Family Medicine

## 2013-02-18 DIAGNOSIS — R1110 Vomiting, unspecified: Secondary | ICD-10-CM | POA: Insufficient documentation

## 2013-02-18 DIAGNOSIS — M129 Arthropathy, unspecified: Secondary | ICD-10-CM | POA: Insufficient documentation

## 2013-02-18 DIAGNOSIS — J029 Acute pharyngitis, unspecified: Secondary | ICD-10-CM | POA: Insufficient documentation

## 2013-02-18 DIAGNOSIS — Z79899 Other long term (current) drug therapy: Secondary | ICD-10-CM | POA: Insufficient documentation

## 2013-02-18 DIAGNOSIS — Z3202 Encounter for pregnancy test, result negative: Secondary | ICD-10-CM | POA: Insufficient documentation

## 2013-02-18 DIAGNOSIS — F17200 Nicotine dependence, unspecified, uncomplicated: Secondary | ICD-10-CM | POA: Insufficient documentation

## 2013-02-18 LAB — COMPREHENSIVE METABOLIC PANEL
ALT: 42 U/L — ABNORMAL HIGH (ref 0–35)
AST: 22 U/L (ref 0–37)
Alkaline Phosphatase: 94 U/L (ref 39–117)
CO2: 26 mEq/L (ref 19–32)
Chloride: 102 mEq/L (ref 96–112)
GFR calc Af Amer: >90 mL/min (ref >90)
GFR calc non Af Amer: >90 mL/min (ref >90)
Glucose, Bld: 80 mg/dL (ref 70–99)
Potassium: 3.1 mEq/L — ABNORMAL LOW (ref 3.5–5.1)
Sodium: 140 mEq/L (ref 135–145)

## 2013-02-18 LAB — CBC WITH DIFFERENTIAL/PLATELET
Basophils Relative: 1 % (ref 0–1)
Eosinophils Relative: 1 % (ref 0–5)
Hemoglobin: 13.6 g/dL (ref 12.0–15.0)
Lymphocytes Relative: 36 % (ref 12–46)
MCH: 30.8 pg (ref 26.0–34.0)
Monocytes Relative: 9 % (ref 3–12)
Neutrophils Relative %: 53 % (ref 43–77)
Platelets: 194 10*3/uL (ref 150–400)
RBC: 4.42 MIL/uL (ref 3.87–5.11)
WBC: 8.4 10*3/uL (ref 4.0–10.5)

## 2013-02-18 LAB — POCT PREGNANCY, URINE: Preg Test, Ur: NEGATIVE

## 2013-02-18 MED ORDER — GI COCKTAIL ~~LOC~~
30.0000 mL | Freq: Once | ORAL | Status: AC
  Administered 2013-02-18: 30 mL via ORAL
  Filled 2013-02-18: qty 30

## 2013-02-18 MED ORDER — SODIUM CHLORIDE 0.9 % IV BOLUS (SEPSIS)
1000.0000 mL | Freq: Once | INTRAVENOUS | Status: AC
  Administered 2013-02-18: 1000 mL via INTRAVENOUS

## 2013-02-18 MED ORDER — METOCLOPRAMIDE HCL 5 MG/ML IJ SOLN
10.0000 mg | Freq: Once | INTRAMUSCULAR | Status: AC
  Administered 2013-02-18: 10 mg via INTRAVENOUS
  Filled 2013-02-18: qty 2

## 2013-02-18 MED ORDER — MORPHINE SULFATE 4 MG/ML IJ SOLN
4.0000 mg | Freq: Once | INTRAMUSCULAR | Status: AC
  Administered 2013-02-18: 4 mg via INTRAVENOUS
  Filled 2013-02-18: qty 1

## 2013-02-18 NOTE — ED Provider Notes (Signed)
History  This chart was scribed for non-physician practitioner working with Flint Melter, MD by Greggory Stallion, ED scribe. This patient was seen in room WTR2/WLPT2 and the patient's care was started at 9:15 PM.  CSN: [id number] Arrival date & time 02/18/13  2101    Chief Complaint  Patient presents with  . Sore Throat    The history is provided by the patient. No language interpreter was used.    HPI Comments: Kaitlyn Kramer is a 27 y.o. female who presents to the Emergency Department complaining of sore throat that started one week ago. Pt states it burns to drink anything. Pt states she was seen here a few days and diagnosed with a UTI. Pt states she has emesis. She states she is not sure if she has had hematemesis because something red was in her emesis but it hasn't happened since. Pt states she has upper abdominal pain that feels like really bad heartburn. She states her last fever was yesterday and was 100. Pt states she has taken Ibuprofen, Cipro, and Hydrocodone with no relief. Pt states the last time she ate was Saturday. She states she has been trying to drink but it's hard to.  Past Medical History  Diagnosis Date  . Arthritis    Past Surgical History  Procedure Laterality Date  . Dilation and curettage of uterus    . Tubal ligation    . Incision and drainage abscess anal     No family history on file. History  Substance Use Topics  . Smoking status: Current Every Day Smoker  . Smokeless tobacco: Not on file  . Alcohol Use: No   OB History   Grav Para Term Preterm Abortions TAB SAB Ect Mult Living                 Review of Systems  HENT: Positive for sore throat.   Gastrointestinal: Positive for vomiting.  All other systems reviewed and are negative.    Allergies  Review of patient's allergies indicates no known allergies.  Home Medications   Current Outpatient Rx  Name  Route  Sig  Dispense  Refill  . ciprofloxacin (CIPRO) 500 MG tablet   Oral  Take 500 mg by mouth 2 (two) times daily.         Marland Kitchen HYDROcodone-acetaminophen (NORCO) 10-325 MG per tablet   Oral   Take 1 tablet by mouth every 8 (eight) hours as needed for pain.         Marland Kitchen ibuprofen (ADVIL,MOTRIN) 800 MG tablet   Oral   Take 800 mg by mouth every 8 (eight) hours as needed for pain.          BP 128/80  Pulse 105  Temp(Src) 98.9 F (37.2 C) (Oral)  Resp 18  Ht 5\' 7"  (1.702 m)  Wt 204 lb (92.534 kg)  BMI 31.94 kg/m2  SpO2 100%  LMP 02/06/2013  Physical Exam  Nursing note and vitals reviewed. Constitutional: She is oriented to person, place, and time. She appears well-developed and well-nourished. No distress.  HENT:  Head: Normocephalic and atraumatic. No trismus in the jaw.  Right Ear: External ear normal.  Left Ear: External ear normal.  Nose: Nose normal.  Mouth/Throat: Uvula is midline and oropharynx is clear and moist. Mucous membranes are dry.  Mild bilaterally enlarge tonsils - equal and symmetric. No trismus. No tongue elevation. No submental edema.   Eyes: Conjunctivae are normal.  Neck: Normal range of motion.  Cardiovascular: Normal  rate, regular rhythm and normal heart sounds.   Pulmonary/Chest: Effort normal and breath sounds normal. No stridor. No respiratory distress. She has no wheezes. She has no rales.  Abdominal: Soft. She exhibits no distension.  Musculoskeletal: Normal range of motion.  Neurological: She is alert and oriented to person, place, and time. She has normal strength.  Skin: Skin is warm and dry. She is not diaphoretic. No erythema.  Psychiatric: She has a normal mood and affect. Her behavior is normal.    ED Course  Procedures (including critical care time)  DIAGNOSTIC STUDIES: Oxygen Saturation is 100% on RA, normal by my interpretation.    COORDINATION OF CARE: 9:28 PM-Discussed treatment plan with pt at bedside and pt agreed to plan.   Labs Reviewed  COMPREHENSIVE METABOLIC PANEL - Abnormal; Notable for the  following:    Potassium 3.1 (*)    ALT 42 (*)    All other components within normal limits  URINALYSIS, ROUTINE W REFLEX MICROSCOPIC - Abnormal; Notable for the following:    Color, Urine AMBER (*)    APPearance CLOUDY (*)    Hgb urine dipstick SMALL (*)    Bilirubin Urine SMALL (*)    Leukocytes, UA SMALL (*)    All other components within normal limits  URINE MICROSCOPIC-ADD ON - Abnormal; Notable for the following:    Squamous Epithelial / LPF MANY (*)    All other components within normal limits  CBC WITH DIFFERENTIAL  LIPASE, BLOOD  POCT PREGNANCY, URINE   No results found. 1. Pharyngitis     MDM  Patient presents for pharyngitis for the past 6 days. She has been previously evaluated this. Per patient she has been unable to eat or drink anything since Saturday due to vomiting. No concern for impending airway obstruction, peritonsillar abscess, or spread of infection to soft tissue. Mucus membranes dry. She was given a 1L bolus of fluid, morphine, and reglan with mild improvement. GI cocktail with improvement. After GI cocktail she was drinking water, but then began vomiting while being discharged. She needs to pass a PO fluid challenge prior to discharge. Additional reglan dose given. PO fluid challenge. Patient signed out to Earley Favor, NP at shift change.   Medications  sodium chloride 0.9 % bolus 1,000 mL (0 mLs Intravenous Stopped 02/18/13 2351)  morphine 4 MG/ML injection 4 mg (4 mg Intravenous Given 02/18/13 2221)  metoCLOPramide (REGLAN) injection 10 mg (10 mg Intravenous Given 02/18/13 2221)  gi cocktail (Maalox,Lidocaine,Donnatal) (30 mLs Oral Given 02/18/13 2353)  dexamethasone (DECADRON) injection 10 mg (10 mg Intravenous Given 02/19/13 0054)  metoCLOPramide (REGLAN) injection 10 mg (10 mg Intravenous Given 02/19/13 0114)         I personally performed the services described in this documentation, which was scribed in my presence. The recorded information has been  reviewed and is accurate.    Mora Bellman, PA-C 02/19/13 1759

## 2013-02-18 NOTE — ED Notes (Signed)
Patient states she has had a sore throat for a week. States she has pain and burning when she swallows. States she also has a wisdom tooth that is coming in. Has taken Ibuprofen, Cipro, and Hydrocodone without relief of symptoms. Was told by [facility] to follow-up with ENT and ENT can't see her until Thursday.

## 2013-02-18 NOTE — ED Provider Notes (Signed)
Medical screening examination/treatment/procedure(s) were performed by non-physician practitioner and as supervising physician I was immediately available for consultation/collaboration.   Shelda Jakes, MD 02/18/13 [phone]

## 2013-02-19 LAB — URINE MICROSCOPIC-ADD ON

## 2013-02-19 LAB — URINALYSIS, ROUTINE W REFLEX MICROSCOPIC
Glucose, UA: NEGATIVE mg/dL
Protein, ur: NEGATIVE mg/dL
Urobilinogen, UA: 1 mg/dL (ref 0.0–1.0)

## 2013-02-19 MED ORDER — FAMOTIDINE 20 MG PO TABS: 20.0000 mg | ORAL_TABLET | Freq: Two times a day (BID) | ORAL | Status: DC

## 2013-02-19 MED ORDER — METOCLOPRAMIDE HCL 5 MG/ML IJ SOLN
10.0000 mg | Freq: Once | INTRAMUSCULAR | Status: AC
  Administered 2013-02-19: 10 mg via INTRAVENOUS
  Filled 2013-02-19: qty 2

## 2013-02-19 MED ORDER — DEXAMETHASONE SODIUM PHOSPHATE 10 MG/ML IJ SOLN
10.0000 mg | Freq: Once | INTRAMUSCULAR | Status: AC
  Administered 2013-02-19: 10 mg via INTRAVENOUS
  Filled 2013-02-19: qty 1

## 2013-02-19 MED ORDER — OMEPRAZOLE 20 MG PO CPDR: 20.0000 mg | DELAYED_RELEASE_CAPSULE | Freq: Every day | ORAL | Status: DC

## 2013-02-19 NOTE — ED Notes (Signed)
Pt able to keep down water, states she's ready to go home, discharge papers to be given and pt to be sent home.

## 2013-02-20 NOTE — ED Provider Notes (Signed)
Medical screening examination/treatment/procedure(s) were performed by non-physician practitioner and as supervising physician I was immediately available for consultation/collaboration.  Burhan Barham L Lj Miyamoto, MD 02/20/13 0739 

## 2013-09-24 ENCOUNTER — Encounter (HOSPITAL_COMMUNITY): Payer: Self-pay | Admitting: Emergency Medicine

## 2013-09-24 ENCOUNTER — Emergency Department (HOSPITAL_COMMUNITY)
Admission: EM | Admit: 2013-09-24 | Discharge: 2013-09-24 | Disposition: A | Discharge status: Home / Self Care | Payer: Medicaid Other | Attending: Emergency Medicine | Admitting: Emergency Medicine

## 2013-09-24 DIAGNOSIS — K029 Dental caries, unspecified: Secondary | ICD-10-CM | POA: Insufficient documentation

## 2013-09-24 DIAGNOSIS — K0889 Other specified disorders of teeth and supporting structures: Secondary | ICD-10-CM

## 2013-09-24 DIAGNOSIS — K089 Disorder of teeth and supporting structures, unspecified: Secondary | ICD-10-CM | POA: Insufficient documentation

## 2013-09-24 DIAGNOSIS — Z8739 Personal history of other diseases of the musculoskeletal system and connective tissue: Secondary | ICD-10-CM | POA: Insufficient documentation

## 2013-09-24 DIAGNOSIS — F17200 Nicotine dependence, unspecified, uncomplicated: Secondary | ICD-10-CM | POA: Insufficient documentation

## 2013-09-24 MED ORDER — PENICILLIN V POTASSIUM 500 MG PO TABS: 500.0000 mg | ORAL_TABLET | Freq: Four times a day (QID) | ORAL | Status: DC

## 2013-09-24 MED ORDER — TRAMADOL HCL 50 MG PO TABS: 50.0000 mg | ORAL_TABLET | Freq: Four times a day (QID) | ORAL | Status: DC | PRN

## 2013-09-24 NOTE — ED Provider Notes (Signed)
CSN: [id number]     Arrival date & time 09/24/13  1055 History  This chart was scribed for Francee Piccolo, PA-C, working with Shanna Cisco, MD by Blanchard Kelch, ED Scribe. This patient was seen in room TR10C/TR10C and the patient's care was started at 12:08 PM.     Chief Complaint  Patient presents with  . Dental Pain   Patient is a 28 y.o. female presenting with tooth pain. The history is provided by the patient. No language interpreter was used.  Dental Pain Associated symptoms: no congestion and no fever     HPI Comments: GIABELLA DUHART is a 28 y.o. female who presents to the Emergency Department complaining of constant moderate to severe left upper dental pain that began yesterday. She states that she believes it is her wisdom tooth coming in and felt it come through the gums yesterday. She has been taking Motrin and Tylenol without relief. She denies any drainage. She denies fever, chills, sore throat, cough, congestion or rhinorrhea. She does not have a dentist currently.    Past Medical History  Diagnosis Date  . Arthritis    Past Surgical History  Procedure Laterality Date  . Dilation and curettage of uterus    . Tubal ligation    . Incision and drainage abscess anal     No family history on file. History  Substance Use Topics  . Smoking status: Current Every Day Smoker  . Smokeless tobacco: Not on file  . Alcohol Use: No   OB History   Grav Para Term Preterm Abortions TAB SAB Ect Mult Living                 Review of Systems  Constitutional: Negative for fever and chills.  HENT: Positive for dental problem. Negative for congestion, rhinorrhea and sore throat.   Respiratory: Negative for cough.   All other systems reviewed and are negative.    Allergies  Naproxen  Home Medications   Current Outpatient Rx  Name  Route  Sig  Dispense  Refill  . penicillin v potassium (VEETID) 500 MG tablet   Oral   Take 1 tablet (500 mg total) by mouth 4 (four)  times daily.   40 tablet   0   . traMADol (ULTRAM) 50 MG tablet   Oral   Take 1 tablet (50 mg total) by mouth every 6 (six) hours as needed.   15 tablet   0    Triage Vitals: BP 130/66  Pulse 74  Temp(Src) 97.9 F (36.6 C) (Oral)  Resp 16  Ht 5\' 7"  (1.702 m)  Wt 203 lb 7 oz (92.279 kg)  BMI 31.86 kg/m2  SpO2 100%  Physical Exam  Nursing note and vitals reviewed. Constitutional: She is oriented to person, place, and time. She appears well-developed and well-nourished. No distress.  HENT:  Head: Normocephalic and atraumatic.  Right Ear: External ear normal.  Left Ear: External ear normal.  Nose: Nose normal.  Mouth/Throat: Uvula is midline, oropharynx is clear and moist and mucous membranes are normal. No trismus in the jaw. Abnormal dentition. Dental caries present. No uvula swelling. No oropharyngeal exudate.  Eyes: Conjunctivae are normal.  Neck: Normal range of motion. Neck supple.  Cardiovascular: Normal rate, regular rhythm and normal heart sounds.   Pulmonary/Chest: Effort normal and breath sounds normal.  Musculoskeletal: Normal range of motion.  Lymphadenopathy:    She has no cervical adenopathy.  Neurological: She is alert and oriented to person, place, and  time.  Skin: Skin is warm and dry. She is not diaphoretic.  Psychiatric: She has a normal mood and affect.    ED Course  Procedures (including critical care time)  DIAGNOSTIC STUDIES: Oxygen Saturation is 100% on room air, normal by my interpretation.    COORDINATION OF CARE: 12:13 PM -Will discharge with pain medication and resource guide for follow up with dentist. Patient verbalizes understanding and agrees with treatment plan.    Labs Review Labs Reviewed - No data to display Imaging Review No results found.  EKG Interpretation   None       MDM   1. Pain, dental     Filed Vitals:   09/24/13 1108  BP: 130/66  Pulse: 74  Temp: 97.9 F (36.6 C)  Resp: 16    Afebrile, NAD,  non-toxic appearing, AAOx4. Patient with toothache.  No gross abscess.  Exam unconcerning for Ludwig's angina or spread of infection.  Will treat with penicillin and pain medicine.  Urged patient to follow-up with dentist.  Return precautions discussed. Patient is agreeable to plan. Patient is stable at time of discharge     I personally performed the services described in this documentation, which was scribed in my presence. The recorded information has been reviewed and is accurate.    Jeannetta EllisJennifer L Thaison Kolodziejski, PA-C 09/24/13 1351

## 2013-09-24 NOTE — ED Notes (Signed)
Left  Upper tooth pain pt thinks may be wisdom tooth

## 2013-09-24 NOTE — Discharge Instructions (Signed)
Please follow up with your primary care physician in 1-2 days. If you do not have one please call the [facility] number listed above. Please follow up with the dentist to schedule a follow up appointment. Please take pain medication as prescribed and as needed for pain. Please do not drive on narcotic pain medication. Please take your antibiotic until completion. Please read all discharge instructions and return precautions.     Dental Pain A tooth ache may be caused by cavities (tooth decay). Cavities expose the nerve of the tooth to air and hot or cold temperatures. It may come from an infection or abscess (also called a boil or furuncle) around your tooth. It is also often caused by dental caries (tooth decay). This causes the pain you are having. DIAGNOSIS  Your caregiver can diagnose this problem by exam. TREATMENT   If caused by an infection, it may be treated with medications which kill germs (antibiotics) and pain medications as prescribed by your caregiver. Take medications as directed.  Only take over-the-counter or prescription medicines for pain, discomfort, or fever as directed by your caregiver.  Whether the tooth ache today is caused by infection or dental disease, you should see your dentist as soon as possible for further care. SEEK MEDICAL CARE IF: The exam and treatment you received today has been provided on an emergency basis only. This is not a substitute for complete medical or dental care. If your problem worsens or new problems (symptoms) appear, and you are unable to meet with your dentist, call or return to this location. SEEK IMMEDIATE MEDICAL CARE IF:   You have a fever.  You develop redness and swelling of your face, jaw, or neck.  You are unable to open your mouth.  You have severe pain uncontrolled by pain medicine. MAKE SURE YOU:   Understand these instructions.  Will watch your condition.  Will get help right away if you are not  doing well or get worse. Document Released: 08/14/2005 Document Revised: 11/06/2011 Document Reviewed: 04/01/2008 [facility] Patient Information 2014 [place] [place].

## 2013-09-24 NOTE — ED Provider Notes (Signed)
Medical screening examination/treatment/procedure(s) were performed by non-physician practitioner and as supervising physician I was immediately available for consultation/collaboration.  EKG Interpretation   None         Pressley Barsky E Loveta Dellis, MD 09/24/13 2003 

## 2014-05-20 ENCOUNTER — Emergency Department (HOSPITAL_COMMUNITY): Payer: Self-pay

## 2014-05-20 ENCOUNTER — Encounter (HOSPITAL_COMMUNITY): Payer: Self-pay | Admitting: Emergency Medicine

## 2014-05-20 ENCOUNTER — Inpatient Hospital Stay (HOSPITAL_COMMUNITY)
Admission: EM | Admit: 2014-05-20 | Discharge: 2014-05-24 | DRG: 872 | Disposition: A | Discharge status: Home / Self Care | Payer: Self-pay | Attending: Internal Medicine | Admitting: Internal Medicine

## 2014-05-20 ENCOUNTER — Emergency Department (HOSPITAL_COMMUNITY): Payer: Medicaid Other

## 2014-05-20 DIAGNOSIS — R6510 Systemic inflammatory response syndrome (SIRS) of non-infectious origin without acute organ dysfunction: Secondary | ICD-10-CM

## 2014-05-20 DIAGNOSIS — N200 Calculus of kidney: Secondary | ICD-10-CM | POA: Diagnosis present

## 2014-05-20 DIAGNOSIS — R112 Nausea with vomiting, unspecified: Secondary | ICD-10-CM

## 2014-05-20 DIAGNOSIS — N12 Tubulo-interstitial nephritis, not specified as acute or chronic: Secondary | ICD-10-CM

## 2014-05-20 DIAGNOSIS — R21 Rash and other nonspecific skin eruption: Secondary | ICD-10-CM

## 2014-05-20 DIAGNOSIS — N279 Small kidney, unspecified: Secondary | ICD-10-CM | POA: Diagnosis present

## 2014-05-20 DIAGNOSIS — N201 Calculus of ureter: Secondary | ICD-10-CM

## 2014-05-20 DIAGNOSIS — A419 Sepsis, unspecified organism: Principal | ICD-10-CM

## 2014-05-20 DIAGNOSIS — F17200 Nicotine dependence, unspecified, uncomplicated: Secondary | ICD-10-CM | POA: Diagnosis present

## 2014-05-20 DIAGNOSIS — Z792 Long term (current) use of antibiotics: Secondary | ICD-10-CM

## 2014-05-20 DIAGNOSIS — M129 Arthropathy, unspecified: Secondary | ICD-10-CM | POA: Diagnosis present

## 2014-05-20 HISTORY — DX: Urinary tract infection, site not specified: N39.0

## 2014-05-20 LAB — URINE MICROSCOPIC-ADD ON

## 2014-05-20 LAB — BASIC METABOLIC PANEL
Anion gap: 14 (ref 5–15)
BUN: 6 mg/dL (ref 6–23)
CO2: 23 mEq/L (ref 19–32)
CREATININE: 0.72 mg/dL (ref 0.50–1.10)
Calcium: 9.1 mg/dL (ref 8.4–10.5)
Chloride: 99 mEq/L (ref 96–112)
GFR calc Af Amer: >90 mL/min (ref >90)
GLUCOSE: 95 mg/dL (ref 70–99)
POTASSIUM: 4.1 meq/L (ref 3.7–5.3)
Sodium: 136 mEq/L — ABNORMAL LOW (ref 137–147)

## 2014-05-20 LAB — URINALYSIS, ROUTINE W REFLEX MICROSCOPIC
BILIRUBIN URINE: NEGATIVE
Glucose, UA: NEGATIVE mg/dL
NITRITE: NEGATIVE
Protein, ur: NEGATIVE mg/dL
SPECIFIC GRAVITY, URINE: 1.015 (ref 1.005–1.030)
UROBILINOGEN UA: 1 mg/dL (ref 0.0–1.0)
pH: 5.5 (ref 5.0–8.0)

## 2014-05-20 LAB — WET PREP, GENITAL
TRICH WET PREP: NONE SEEN
YEAST WET PREP: NONE SEEN

## 2014-05-20 LAB — CBC WITH DIFFERENTIAL/PLATELET
BASOS PCT: 0 % (ref 0–1)
Basophils Absolute: 0 10*3/uL (ref 0.0–0.1)
EOS ABS: 0 10*3/uL (ref 0.0–0.7)
EOS PCT: 0 % (ref 0–5)
HCT: 39.7 % (ref 36.0–46.0)
HEMOGLOBIN: 13.7 g/dL (ref 12.0–15.0)
LYMPHS ABS: 1.5 10*3/uL (ref 0.7–4.0)
Lymphocytes Relative: 9 % — ABNORMAL LOW (ref 12–46)
MCH: 31.1 pg (ref 26.0–34.0)
MCHC: 34.5 g/dL (ref 30.0–36.0)
MCV: 90 fL (ref 78.0–100.0)
MONOS PCT: 8 % (ref 3–12)
Monocytes Absolute: 1.3 10*3/uL — ABNORMAL HIGH (ref 0.1–1.0)
Neutro Abs: 13.2 10*3/uL — ABNORMAL HIGH (ref 1.7–7.7)
Neutrophils Relative %: 83 % — ABNORMAL HIGH (ref 43–77)
Platelets: 221 10*3/uL (ref 150–400)
RBC: 4.41 MIL/uL (ref 3.87–5.11)
RDW: 12.6 % (ref 11.5–15.5)
WBC: 16 10*3/uL — ABNORMAL HIGH (ref 4.0–10.5)

## 2014-05-20 LAB — I-STAT CG4 LACTIC ACID, ED: LACTIC ACID, VENOUS: 0.47 mmol/L — AB (ref 0.5–2.2)

## 2014-05-20 LAB — POC URINE PREG, ED: PREG TEST UR: NEGATIVE

## 2014-05-20 MED ORDER — DEXTROSE 5 % IV SOLN
1.0000 g | Freq: Every day | INTRAVENOUS | Status: DC
  Filled 2014-05-20: qty 10

## 2014-05-20 MED ORDER — SODIUM CHLORIDE 0.9 % IV SOLN
1000.0000 mL | INTRAVENOUS | Status: DC
Start: 2014-05-20 — End: 2014-05-24
  Administered 2014-05-21: 1000 mL via INTRAVENOUS

## 2014-05-20 MED ORDER — DEXTROSE 5 % IV SOLN
2.0000 g | Freq: Once | INTRAVENOUS | Status: AC
  Administered 2014-05-20: 2 g via INTRAVENOUS
  Filled 2014-05-20: qty 2

## 2014-05-20 MED ORDER — SODIUM CHLORIDE 0.9 % IV BOLUS (SEPSIS)
30.0000 mL/kg | Freq: Once | INTRAVENOUS | Status: AC
  Administered 2014-05-20: 2721 mL via INTRAVENOUS

## 2014-05-20 MED ORDER — MORPHINE SULFATE 4 MG/ML IJ SOLN
4.0000 mg | Freq: Once | INTRAMUSCULAR | Status: AC
  Administered 2014-05-20: 4 mg via INTRAVENOUS
  Filled 2014-05-20: qty 1

## 2014-05-20 MED ORDER — ACETAMINOPHEN 325 MG PO TABS
650.0000 mg | ORAL_TABLET | Freq: Once | ORAL | Status: AC
  Administered 2014-05-20: 650 mg via ORAL
  Filled 2014-05-20: qty 2

## 2014-05-20 MED ORDER — ONDANSETRON HCL 4 MG/2ML IJ SOLN
4.0000 mg | Freq: Once | INTRAMUSCULAR | Status: AC
  Administered 2014-05-20: 4 mg via INTRAVENOUS
  Filled 2014-05-20: qty 2

## 2014-05-20 NOTE — ED Provider Notes (Signed)
CSN: [id number]     Arrival date & time 05/20/14  1906 History   First MD Initiated Contact with Patient 05/20/14 2250     Chief Complaint  Patient presents with  . Flank Pain     (Consider location/radiation/quality/duration/timing/severity/associated sxs/prior Treatment) HPI Comments: This is a G6 P4 0 2 2   28  year old female past medical history significant for tobacco abuse presenting to the emergency department for acute onset suprapubic pain that has been progressively worsening. She states she initially had pain and pressure while voiding, her pain has become more constant. She has had nausea with 2 episodes of nonbloody nonbilious emesis this morning. No alleviating or aggravating factors. Patient has not taken any antipyretics at home. She is also complaining of green vaginal discharge. She does endorse recent unprotected intercourse, with her husband. LMP first week of September. Patient has also had a rash since August after camping. She denies any tick bites at that time, and that she spent prolonged amounts of time outside of the RV.   Patient is a 28 y.o. female presenting with flank pain.  Flank Pain Associated symptoms include abdominal pain, chills, a fever, nausea and vomiting.    Past Medical History  Diagnosis Date  . Arthritis    Past Surgical History  Procedure Laterality Date  . Dilation and curettage of uterus    . Tubal ligation    . Incision and drainage abscess anal     History reviewed. No pertinent family history. History  Substance Use Topics  . Smoking status: Current Every Day Smoker  . Smokeless tobacco: Not on file  . Alcohol Use: No   OB History   Grav Para Term Preterm Abortions TAB SAB Ect Mult Living                 Review of Systems  Constitutional: Positive for fever and chills.  Gastrointestinal: Positive for nausea, vomiting and abdominal pain. Negative for diarrhea and constipation.  Genitourinary: Positive for dysuria and flank  pain.  All other systems reviewed and are negative.     Allergies  Naproxen  Home Medications   Prior to Admission medications   Medication Sig Start Date End Date Taking? Authorizing Provider  miconazole nitrate (MONISTAT 3 COMBINATION PACK) 200-2 MG-% (9GM) KIT Place 1 applicator vaginally daily. For 3 days   Yes Historical Provider, MD   BP 101/43  Pulse 103  Temp(Src) 100.1 F (37.8 C) (Oral)  Resp 21  Ht 5' 7"  (1.702 m)  Wt 200 lb (90.719 kg)  BMI 31.32 kg/m2  SpO2 97%  LMP 04/27/2014 Physical Exam  Nursing note and vitals reviewed. Constitutional: She is oriented to person, place, and time. She appears well-developed and well-nourished. No distress.  HENT:  Head: Normocephalic and atraumatic.  Right Ear: External ear normal.  Left Ear: External ear normal.  Nose: Nose normal.  Mouth/Throat: Oropharynx is clear and moist.  Eyes: Conjunctivae are normal.  Neck: Normal range of motion. Neck supple.  Cardiovascular: Normal rate, regular rhythm and normal heart sounds.   Pulmonary/Chest: Effort normal and breath sounds normal. No respiratory distress.  Abdominal: Soft. Bowel sounds are normal. She exhibits no distension. There is tenderness in the suprapubic area. There is no rigidity, no rebound, no guarding and no CVA tenderness.  Musculoskeletal: Normal range of motion.  Neurological: She is alert and oriented to person, place, and time.  Skin: Skin is warm and dry. Rash noted. Rash is macular (chest). She is not diaphoretic.  Psychiatric: She has a normal mood and affect.   Exam performed by Baron Sane L,  exam chaperoned Date: 05/20/2014 Pelvic exam: normal external genitalia without evidence of trauma. VULVA: normal appearing vulva with no masses, tenderness or lesion. VAGINA: normal appearing vagina with normal color and discharge, no lesions. CERVIX: normal appearing cervix without lesions, cervical motion tenderness absent, cervical os closed with  out purulent discharge; vaginal discharge - white, Wet prep and DNA probe for chlamydia and GC obtained.   ADNEXA: normal adnexa in size, nontender and no masses UTERUS: uterus is normal size, shape, consistency and nontender.   ED Course  Procedures (including critical care time) Medications  sodium chloride 0.9 % bolus 2,721 mL (0 mL/kg  90.7 kg Intravenous Stopped 05/21/14 0005)    Followed by  0.9 %  sodium chloride infusion (1,000 mLs Intravenous New Bag/Given 05/21/14 0006)  cefTRIAXone (ROCEPHIN) 1 g in dextrose 5 % 50 mL IVPB (not administered)  acetaminophen (TYLENOL) tablet 650 mg (650 mg Oral Given 05/20/14 2323)  cefTRIAXone (ROCEPHIN) 2 g in dextrose 5 % 50 mL IVPB (0 g Intravenous Stopped 05/21/14 0027)  morphine 4 MG/ML injection 4 mg (4 mg Intravenous Given 05/20/14 2341)  ondansetron (ZOFRAN) injection 4 mg (4 mg Intravenous Given 05/20/14 2341)    Labs Review Labs Reviewed  WET PREP, GENITAL - Abnormal; Notable for the following:    Clue Cells Wet Prep HPF POC FEW (*)    WBC, Wet Prep HPF POC FEW (*)    All other components within normal limits  URINALYSIS, ROUTINE W REFLEX MICROSCOPIC - Abnormal; Notable for the following:    Color, Urine GREEN (*)    APPearance CLOUDY (*)    Hgb urine dipstick MODERATE (*)    Ketones, ur >80 (*)    Leukocytes, UA MODERATE (*)    All other components within normal limits  BASIC METABOLIC PANEL - Abnormal; Notable for the following:    Sodium 136 (*)    All other components within normal limits  CBC WITH DIFFERENTIAL - Abnormal; Notable for the following:    WBC 16.0 (*)    Neutrophils Relative % 83 (*)    Neutro Abs 13.2 (*)    Lymphocytes Relative 9 (*)    Monocytes Absolute 1.3 (*)    All other components within normal limits  URINE MICROSCOPIC-ADD ON - Abnormal; Notable for the following:    Squamous Epithelial / LPF FEW (*)    Bacteria, UA MANY (*)    All other components within normal limits  HEPATIC FUNCTION PANEL -  Abnormal; Notable for the following:    Albumin 3.4 (*)    AST 38 (*)    ALT 53 (*)    All other components within normal limits  I-STAT CG4 LACTIC ACID, ED - Abnormal; Notable for the following:    Lactic Acid, Venous 0.47 (*)    All other components within normal limits  CULTURE, BLOOD (ROUTINE X 2)  CULTURE, BLOOD (ROUTINE X 2)  URINE CULTURE  GC/CHLAMYDIA PROBE AMP  HIV ANTIBODY (ROUTINE TESTING)  ROCKY MTN SPOTTED FVR AB, IGG-BLOOD  ROCKY MTN SPOTTED FVR AB, IGM-BLOOD  B. BURGDORFI ANTIBODIES  POC URINE PREG, ED    Imaging Review Ct Abdomen Pelvis Wo Contrast  05/21/2014   CLINICAL DATA:  Right lower quadrant pain, right flank pain, pain during urination  EXAM: CT ABDOMEN AND PELVIS WITHOUT CONTRAST  TECHNIQUE: Multidetector CT imaging of the abdomen and pelvis was performed following the standard  protocol without IV contrast.  COMPARISON:  None.  FINDINGS: Visualized lung bases clear.  No acute musculoskeletal findings.  Liver is normal. Mild differential attenuation within the gallbladder suggests the possibility of small volume of sludge. Spleen is normal. Pancreas is normal. Adrenal glands are normal. Left kidney is normal. 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 On the right side, there is minimal perinephric inflammatory change. Is a punctate density in the proximal right ureter just a few cm beyond the ureteropelvic junction. There is mild periureteral inflammation down to this level, with the right ureter beyond this level within normal.  No significant adenopathy. Numerous small nonpathologic retroperitoneal and mediastinal lymph nodes are present.  Stomach, small bowel, large bowel, and appendix are normal. Bladder is normal. Reproductive organs are normal.  IMPRESSION: Minimal perinephric inflammatory change on the right can be seen with pyelonephritis. A can also be related to struck. The proximal 4 cm of the right ureter are minimally prominent with a  1-2 mm punctate density in the ureter at this level possibly a tiny stone.   Electronically Signed   By: Skipper Cliche M.D.   On: 05/21/2014 00:44   Dg Chest Port 1 View  05/20/2014   CLINICAL DATA:  Fever, flank pain.  EXAM: PORTABLE CHEST - 1 VIEW  COMPARISON:  None.  FINDINGS: The heart size and mediastinal contours are within normal limits. Both lungs are clear. The visualized skeletal structures are unremarkable.  IMPRESSION: No active disease.   Electronically Signed   By: Elon Alas   On: 05/20/2014 23:42     EKG Interpretation None      1:11 AM Discussed patient with Dr. Arnoldo Morale who will admit the patient.   MDM   Final diagnoses:  Pyelonephritis  Rash    Filed Vitals:   05/21/14 0000  BP: 101/43  Pulse: 103  Temp:   Resp: 21   Patient presenting with two-day history of left flank pain suprapubic discomfort with associated urinary frequency and dysuria along with discharge. Patient is afebrile but very warm to touch. Abdomen is soft, mild discomfort in the suprapubic region with right-sided low back discomfort. Patient is tachycardic at rest, criticizing him to 120s and 130s while in the room examining patient. This remained persistent until approximately 2 half liters, tachycardia is improved but not resolved. Urine is suspicious for infection versus kidney stone. CT abdomen pelvis without contrast with findings suggestive of pyelonephritis. Patient also with rash on face and chest, developed after a recent camping trip. Lyme disease in [facility] spotted fever titers were sent. Patient will be admitted to the hospital overnight for observation and further evaluation. Patient d/w with Dr. Colin Rhein, agrees with plan.    [place], PA-C 05/21/14 0111

## 2014-05-20 NOTE — ED Notes (Signed)
Left sided flank pain and urinating pressure once she completed voiding. Vomited this morning. Discharge greenish with no foul scent reported. . Yeast infection last week treated with OTC monistat.

## 2014-05-21 ENCOUNTER — Encounter (HOSPITAL_COMMUNITY): Payer: Self-pay | Admitting: Radiology

## 2014-05-21 DIAGNOSIS — N12 Tubulo-interstitial nephritis, not specified as acute or chronic: Secondary | ICD-10-CM | POA: Diagnosis present

## 2014-05-21 DIAGNOSIS — R6510 Systemic inflammatory response syndrome (SIRS) of non-infectious origin without acute organ dysfunction: Secondary | ICD-10-CM

## 2014-05-21 DIAGNOSIS — R112 Nausea with vomiting, unspecified: Secondary | ICD-10-CM | POA: Diagnosis present

## 2014-05-21 DIAGNOSIS — A419 Sepsis, unspecified organism: Secondary | ICD-10-CM | POA: Diagnosis present

## 2014-05-21 LAB — BASIC METABOLIC PANEL
Anion gap: 11 (ref 5–15)
BUN: 4 mg/dL — ABNORMAL LOW (ref 6–23)
CO2: 23 mEq/L (ref 19–32)
CREATININE: 0.74 mg/dL (ref 0.50–1.10)
Calcium: 7.8 mg/dL — ABNORMAL LOW (ref 8.4–10.5)
Chloride: 105 mEq/L (ref 96–112)
GLUCOSE: 87 mg/dL (ref 70–99)
Potassium: 4.5 mEq/L (ref 3.7–5.3)
Sodium: 139 mEq/L (ref 137–147)

## 2014-05-21 LAB — HEPATIC FUNCTION PANEL
ALK PHOS: 95 U/L (ref 39–117)
ALT: 53 U/L — ABNORMAL HIGH (ref 0–35)
AST: 38 U/L — ABNORMAL HIGH (ref 0–37)
Albumin: 3.4 g/dL — ABNORMAL LOW (ref 3.5–5.2)
BILIRUBIN TOTAL: 0.8 mg/dL (ref 0.3–1.2)
Bilirubin, Direct: <0.2 mg/dL (ref 0.0–0.3)
Total Protein: 6.9 g/dL (ref 6.0–8.3)

## 2014-05-21 LAB — PROCALCITONIN

## 2014-05-21 LAB — CBC
HCT: 34.5 % — ABNORMAL LOW (ref 36.0–46.0)
Hemoglobin: 11.6 g/dL — ABNORMAL LOW (ref 12.0–15.0)
MCH: 30.9 pg (ref 26.0–34.0)
MCHC: 33.6 g/dL (ref 30.0–36.0)
MCV: 92 fL (ref 78.0–100.0)
PLATELETS: 173 10*3/uL (ref 150–400)
RBC: 3.75 MIL/uL — AB (ref 3.87–5.11)
RDW: 12.9 % (ref 11.5–15.5)
WBC: 10.8 10*3/uL — ABNORMAL HIGH (ref 4.0–10.5)

## 2014-05-21 LAB — HIV ANTIBODY (ROUTINE TESTING W REFLEX): HIV 1&2 Ab, 4th Generation: NONREACTIVE

## 2014-05-21 MED ORDER — DEXTROSE 5 % IV SOLN
2.0000 g | INTRAVENOUS | Status: DC
  Administered 2014-05-21 – 2014-05-23 (×3): 2 g via INTRAVENOUS
  Filled 2014-05-21 (×3): qty 2

## 2014-05-21 MED ORDER — ALUM & MAG HYDROXIDE-SIMETH 200-200-20 MG/5ML PO SUSP: 30.0000 mL | Freq: Four times a day (QID) | ORAL | Status: DC | PRN

## 2014-05-21 MED ORDER — ONDANSETRON HCL 4 MG PO TABS: 4.0000 mg | ORAL_TABLET | Freq: Four times a day (QID) | ORAL | Status: DC | PRN

## 2014-05-21 MED ORDER — PNEUMOCOCCAL VAC POLYVALENT 25 MCG/0.5ML IJ INJ
0.5000 mL | INJECTION | INTRAMUSCULAR | Status: AC
  Administered 2014-05-22: 0.5 mL via INTRAMUSCULAR
  Filled 2014-05-21: qty 0.5

## 2014-05-21 MED ORDER — IBUPROFEN 400 MG PO TABS: 600.0000 mg | ORAL_TABLET | Freq: Four times a day (QID) | ORAL | Status: DC | PRN

## 2014-05-21 MED ORDER — OXYCODONE HCL 5 MG PO TABS
5.0000 mg | ORAL_TABLET | ORAL | Status: DC | PRN
  Administered 2014-05-21 – 2014-05-24 (×6): 5 mg via ORAL
  Filled 2014-05-21 (×6): qty 1

## 2014-05-21 MED ORDER — DEXTROSE 5 % IV SOLN
1.0000 g | Freq: Three times a day (TID) | INTRAVENOUS | Status: DC
  Administered 2014-05-21: 1 g via INTRAVENOUS
  Filled 2014-05-21 (×3): qty 1

## 2014-05-21 MED ORDER — HYDROMORPHONE HCL 1 MG/ML IJ SOLN
1.0000 mg | INTRAMUSCULAR | Status: DC | PRN
  Administered 2014-05-21 – 2014-05-24 (×15): 1 mg via INTRAVENOUS
  Filled 2014-05-21 (×15): qty 1

## 2014-05-21 MED ORDER — ONDANSETRON HCL 4 MG/2ML IJ SOLN
4.0000 mg | Freq: Four times a day (QID) | INTRAMUSCULAR | Status: DC | PRN
  Administered 2014-05-21 – 2014-05-24 (×5): 4 mg via INTRAVENOUS
  Filled 2014-05-21 (×5): qty 2

## 2014-05-21 MED ORDER — ACETAMINOPHEN 650 MG RE SUPP: 650.0000 mg | Freq: Four times a day (QID) | RECTAL | Status: DC | PRN

## 2014-05-21 MED ORDER — ACETAMINOPHEN 325 MG PO TABS
650.0000 mg | ORAL_TABLET | Freq: Four times a day (QID) | ORAL | Status: DC | PRN
  Administered 2014-05-21 – 2014-05-23 (×5): 650 mg via ORAL
  Filled 2014-05-21 (×7): qty 2

## 2014-05-21 MED ORDER — ENOXAPARIN SODIUM 40 MG/0.4ML ~~LOC~~ SOLN
40.0000 mg | SUBCUTANEOUS | Status: DC
  Administered 2014-05-21 – 2014-05-23 (×3): 40 mg via SUBCUTANEOUS
  Filled 2014-05-21 (×3): qty 0.4

## 2014-05-21 MED ORDER — HYDROMORPHONE HCL 1 MG/ML IJ SOLN: 0.5000 mg | INTRAMUSCULAR | Status: DC | PRN

## 2014-05-21 MED ORDER — SODIUM CHLORIDE 0.9 % IV SOLN
INTRAVENOUS | Status: DC
  Administered 2014-05-21 – 2014-05-24 (×8): via INTRAVENOUS

## 2014-05-21 MED ORDER — IBUPROFEN 600 MG PO TABS
600.0000 mg | ORAL_TABLET | Freq: Four times a day (QID) | ORAL | Status: DC | PRN
  Administered 2014-05-21 – 2014-05-23 (×3): 600 mg via ORAL
  Filled 2014-05-21 (×5): qty 1

## 2014-05-21 MED ORDER — DEXTROSE 5 % IV SOLN: 1.0000 g | INTRAVENOUS | Status: DC

## 2014-05-21 NOTE — Progress Notes (Signed)
Pt admitted to the unit at 1245 Pt mental status is alert and oriented x4. Pt oriented to room, staff, and call bell. Skin is intact. Full assessment charted in [facility]. Call bell within reach. Visitor guidelines reviewed w/ pt and/or family.

## 2014-05-21 NOTE — ED Notes (Signed)
Dr Isidoro Donning paged to address pt temp spike. Blankets removed from pt and sheet for cover given. Pt verbalizes understanding or rational

## 2014-05-21 NOTE — Progress Notes (Signed)
Utilization review completed.  

## 2014-05-21 NOTE — ED Notes (Signed)
Dr. Lovell Sheehan made aware of patients oral temp of 103.1

## 2014-05-21 NOTE — H&P (Signed)
[facility] Admission History and Physical       LEZLY RUMPF UUV:[id number] DOB: [birth year] DOA: 05/20/2014  Referring physician:  EDP PCP: No PCP Per Patient  Specialists:   Chief Complaint:  Right Flank Pain and Fever  HPI: Kaitlyn Kramer is a 28 y.o. female who presents to the ED with complaints of Dysuria and Right Sided Flank pain x 2 days.   She has also had fevers and chills and nausea and vomiting since the AM.   She was evaluated in the ED and has a +UA, and a CT scan was performed which revealed Pyelonephritis and a small kidney Stone.  She was placed on IV rocephin and referred for admission  Review of Systems:  Constitutional: No Weight Loss, No Weight Gain, Night Sweats, +Fevers, +Chills, Dizziness, Fatigue, or Generalized Weakness HEENT: No Headaches, Difficulty Swallowing,Tooth/Dental Problems,Sore Throat,  No Sneezing, Rhinitis, Ear Ache, Nasal Congestion, or Post Nasal Drip,  Cardio-vascular:  No Chest pain, Orthopnea, PND, Edema in Lower Extremities, Anasarca, Dizziness, Palpitations  Resp: No Dyspnea, No DOE, No Productive Cough, No Non-Productive Cough, No Hemoptysis, No Wheezing.    GI: No Heartburn, Indigestion, +Abdominal Pain, +Nausea, +Vomiting, Diarrhea, Hematemesis, Hematochezia, Melena, Change in Bowel Habits,  Loss of Appetite  GU: No Dysuria, Change in Color of Urine, No Urgency or Frequency, +Right Flank pain.  Musculoskeletal: No Joint Pain or Swelling, No Decreased Range of Motion, No Back Pain.  Neurologic: No Syncope, No Seizures, Muscle Weakness, Paresthesia, Vision Disturbance or Loss, No Diplopia, No Vertigo, No Difficulty Walking,  Skin: No Rash or Lesions. Psych: No Change in Mood or Affect, No Depression or Anxiety, No Memory loss, No Confusion, or Hallucinations   Past Medical History  Diagnosis Date  . Arthritis       Past Surgical History  Procedure Laterality Date  . Dilation and curettage of uterus    . Tubal ligation    .  Incision and drainage abscess anal         Prior to Admission medications   Medication Sig Start Date End Date Taking? Authorizing Provider  miconazole nitrate (MONISTAT 3 COMBINATION PACK) 200-2 MG-% (9GM) KIT Place 1 applicator vaginally daily. For 3 days   Yes Historical Provider, MD      Allergies  Allergen Reactions  . Naproxen Hives     Social History:  reports that she has been smoking.  She does not have any smokeless tobacco history on file. She reports that she does not drink alcohol or use illicit drugs.     History reviewed. No pertinent family history.     Physical Exam:  GEN:  Pleasant Well Nourished and Well Developed  28 y.o. Caucasian female examined and in no acute distress; cooperative with exam Filed Vitals:   05/20/14 2245 05/20/14 2300 05/20/14 2340 05/21/14 0000  BP: 123/53 112/53 105/44 101/43  Pulse: 115 106 114 103  Temp:      TempSrc:      Resp: _0 Height:      Weight:      SpO2: 97% 94% 100% 97%   Blood pressure 101/43, pulse 103, temperature 100.1 F (37.8 C), temperature source Oral, resp. rate 21, height _1  (1.702 m), weight 90.719 kg (200 lb), last menstrual period 04/27/2014, SpO2 97.00%. PSYCH: She is alert and oriented x4; does not appear anxious does not appear depressed; affect is normal HEENT: Normocephalic and Atraumatic, Mucous membranes pink; PERRLA; EOM intact; Fundi:  Benign;  No scleral icterus, Nares: Patent, Oropharynx: Clear, Fair Dentition,    Neck:  FROM, No Cervical Lymphadenopathy nor Thyromegaly or Carotid Bruit; No JVD; Breasts:: Not examined CHEST WALL: No tenderness CHEST: Normal respiration, clear to auscultation bilaterally HEART: Regular rate and rhythm; no murmurs rubs or gallops BACK: No kyphosis or scoliosis; No CVA tenderness ABDOMEN: Positive Bowel Sounds, Soft Non-Tender; No Masses, No Organomegaly. Rectal Exam: Not done EXTREMITIES: No Cyanosis, Clubbing, or Edema; No  Ulcerations. Genitalia: not examined PULSES: 2+ and symmetric SKIN: Normal hydration no rash or ulceration CNS: Alert and Oriented x 4, No focal Deficits Vascular: pulses palpable throughout    Labs on Admission:  Basic Metabolic Panel:  Recent Labs Lab 05/20/14 1918  NA 136*  K 4.1  CL 99  CO2 23  GLUCOSE 95  BUN 6  CREATININE 0.72  CALCIUM 9.1   Liver Function Tests:  Recent Labs Lab 05/20/14 2322  AST 38*  ALT 53*  ALKPHOS 95  BILITOT 0.8  PROT 6.9  ALBUMIN 3.4*   No results found for this basename: LIPASE, AMYLASE,  in the last 168 hours No results found for this basename: AMMONIA,  in the last 168 hours CBC:  Recent Labs Lab 05/20/14 1918  WBC 16.0*  NEUTROABS 13.2*  HGB 13.7  HCT 39.7  MCV 90.0  PLT 221   Cardiac Enzymes: No results found for this basename: CKTOTAL, CKMB, CKMBINDEX, TROPONINI,  in the last 168 hours  BNP (last 3 results) No results found for this basename: PROBNP,  in the last 8760 hours CBG: No results found for this basename: GLUCAP,  in the last 168 hours  Radiological Exams on Admission: Ct Abdomen Pelvis Wo Contrast  05/21/2014   CLINICAL DATA:  Right lower quadrant pain, right flank pain, pain during urination  EXAM: CT ABDOMEN AND PELVIS WITHOUT CONTRAST  TECHNIQUE: Multidetector CT imaging of the abdomen and pelvis was performed following the standard protocol without IV contrast.  COMPARISON:  None.  FINDINGS: Visualized lung bases clear.  No acute musculoskeletal findings.  Liver is normal. Mild differential attenuation within the gallbladder suggests the possibility of small volume of sludge. Spleen is normal. Pancreas is normal. Adrenal glands are normal. Left kidney is normal. 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 On the right side, there is minimal perinephric inflammatory change. Is a punctate density in the proximal right ureter just a few cm beyond the ureteropelvic junction. There is mild  periureteral inflammation down to this level, with the right ureter beyond this level within normal.  No significant adenopathy. Numerous small nonpathologic retroperitoneal and mediastinal lymph nodes are present.  Stomach, small bowel, large bowel, and appendix are normal. Bladder is normal. Reproductive organs are normal.  IMPRESSION: Minimal perinephric inflammatory change on the right can be seen with pyelonephritis. A can also be related to struck. The proximal 4 cm of the right ureter are minimally prominent with a 1-2 mm punctate density in the ureter at this level possibly a tiny stone.   Electronically Signed   By: Skipper Cliche M.D.   On: 05/21/2014 00:44   Dg Chest Port 1 View  05/20/2014   CLINICAL DATA:  Fever, flank pain.  EXAM: PORTABLE CHEST - 1 VIEW  COMPARISON:  None.  FINDINGS: The heart size and mediastinal contours are within normal limits. Both lungs are clear. The visualized skeletal structures are unremarkable.  IMPRESSION: No active disease.   Electronically Signed   By: Elon Alas  On: 05/20/2014 23:42       Assessment/Plan:   28 y.o. female with  Active Problems:   1.   Pyelonephritis   Blood Cultures and Urine Cultrue sent   IV Rocephin   IVFs         2.   Sepsis   Same as #1     3.   Nausea and vomiting   PRN IV anti-Emetics    4.    DVT prophylaxis    Lovenox    Code Status:     FULL CODE Family Communication:    No Family Present Disposition Plan:    Inpatient   Time spent:  [place] [facility] Pager [phone]   If [place] Please Contact the Day Rounding Team MD for [facility]  If 7PM-7AM, Please Contact Night-Floor Coverage  [email or web address] Password [email or web address] 05/21/2014, 2:11 AM

## 2014-05-21 NOTE — Progress Notes (Signed)
ANTIBIOTIC CONSULT NOTE - INITIAL  Pharmacy Consult for Aztreonam  Indication: UTI / pyelonephritis   Allergies  Allergen Reactions  . Naproxen Hives    Patient Measurements: Height:  (170.2 cm) Weight: 200 lb (90.719 kg) IBW/kg (Calculated) : 61.6 Adjusted Body Weight: n/a   Vital Signs: Temp: 103 F (39.4 C) (09/24 0456) Temp src: Oral (09/24 0456) BP: 111/63 mmHg (09/24 0456) Pulse Rate: 117 (09/24 0456) Intake/Output from previous day:   Intake/Output from this shift:    Labs:  Recent Labs  05/20/14 1918 05/21/14 0420  WBC 16.0* 10.8*  HGB 13.7 11.6*  PLT 221 173  CREATININE 0.72 0.74   Estimated Creatinine Clearance: 121 ml/min (by C-G formula based on Cr of 0.74). No results found for this basename: VANCOTROUGH, VANCOPEAK, VANCORANDOM, GENTTROUGH, GENTPEAK, GENTRANDOM, TOBRATROUGH, TOBRAPEAK, TOBRARND, AMIKACINPEAK, AMIKACINTROU, AMIKACIN,  in the last 72 hours   Microbiology: Recent Results (from the past 720 hour(s))  WET PREP, GENITAL     Status: Abnormal   Collection Time    05/20/14 11:35 PM      Result Value Ref Range Status   Yeast Wet Prep HPF POC NONE SEEN  NONE SEEN Final   Trich, Wet Prep NONE SEEN  NONE SEEN Final   Clue Cells Wet Prep HPF POC FEW (*) NONE SEEN Final   WBC, Wet Prep HPF POC FEW (*) NONE SEEN Final    Medical History: Past Medical History  Diagnosis Date  . Arthritis     Medications:   (Not in a hospital admission) Assessment: 7 YOF who presented to the ED with complaints of dysuria and right sided flank pain. CT scan revealed pyelonephritis and a small kidney stone. Patient received one dose of IV ceftriaxone in the ED but her temperature continued to trend up. WBC is elevated but has trended down to 10.8. MD consulted pharmacy to switch from ceftriaxone to Aztreonam  9/24: Urine Cx >> 9/24 Blood Cx x2>>   Goal of Therapy:  Resolution of infection   Plan:  1) Start Aztreonam 1 gm IV Q 8 hours 2) Monitor  CBC, renal fx,, cultures and patient's clinical progress 3) If no clinical improvement, consider expanding abx coverage to Zosyn and de-escalate once susceptibilities result   Vinnie Level, PharmD.  Clinical Pharmacist Pager [phone]

## 2014-05-21 NOTE — Progress Notes (Signed)
Patient ID: Kaitlyn Kramer  female  ZOX:[id number]    DOB: [birth year]    DOA: 05/20/2014  PCP: No PCP Per Patient  Assessment/Plan: Principal Problem:  SIRS (systemic inflammatory response syndrome) with  Pyelonephritis, right flank pain -  Continue IV Rocephin, follow urine culture and sensitivities, blood cultures -Continue aggressive IV fluid hydration, Tylenol and Motrin, symptomatic treatment    Active Problems:   Sepsis - Follow urine culture and blood culture and sensitivities    Nausea and vomiting - Continue IV antiemetics   DVT Prophylaxis:Lovenox  Code Status:  Family Communication:  Disposition:  Consultants:  None  Procedures:  None  Antibiotics:  IV Rocephin    Subjective: Patient seen and examined, feeling somewhat better today, nausea and vomiting is improving, still has right flank pain  Objective: Weight change:   Intake/Output Summary (Last 24 hours) at 05/21/14 1305 Last data filed at 05/21/14 0806  Gross per 24 hour  Intake   1000 ml  Output      0 ml  Net   1000 ml   Blood pressure 109/56, pulse 101, temperature 99.1 F (37.3 C), temperature source Oral, resp. rate 16, height  (1.702 m), weight 93.486 kg (206 lb 1.6 oz), last menstrual period 04/27/2014, SpO2 100.00%.  Physical Exam: General: Alert and awake, oriented x3, not in any acute distress. CVS: S1-S2 clear, no murmur rubs or gallops Chest: clear to auscultation bilaterally, no wheezing, rales or rhonchi Abdomen: soft nontender, nondistended, normal bowel sounds , Right CVAT  Extremities: no cyanosis, clubbing or edema noted bilaterally Neuro: Cranial nerves II-XII intact, no focal neurological deficits  Lab Results: Basic Metabolic Panel:  Recent Labs Lab 05/20/14 1918 05/21/14 0420  NA 136* 139  K 4.1 4.5  CL 99 105  CO2 23 23  GLUCOSE 95 87  BUN 6 4*  CREATININE 0.72 0.74  CALCIUM 9.1 7.8*   Liver Function Tests:  Recent Labs Lab 05/20/14 2322    AST 38*  ALT 53*  ALKPHOS 95  BILITOT 0.8  PROT 6.9  ALBUMIN 3.4*   No results found for this basename: LIPASE, AMYLASE,  in the last 168 hours No results found for this basename: AMMONIA,  in the last 168 hours CBC:  Recent Labs Lab 05/20/14 1918 05/21/14 0420  WBC 16.0* 10.8*  NEUTROABS 13.2*  --   HGB 13.7 11.6*  HCT 39.7 34.5*  MCV 90.0 92.0  PLT 221 173   Cardiac Enzymes: No results found for this basename: CKTOTAL, CKMB, CKMBINDEX, TROPONINI,  in the last 168 hours BNP: No components found with this basename: POCBNP,  CBG: No results found for this basename: GLUCAP,  in the last 168 hours   Micro Results: Recent Results (from the past 240 hour(s))  WET PREP, GENITAL     Status: Abnormal   Collection Time    05/20/14 11:35 PM      Result Value Ref Range Status   Yeast Wet Prep HPF POC NONE SEEN  NONE SEEN Final   Trich, Wet Prep NONE SEEN  NONE SEEN Final   Clue Cells Wet Prep HPF POC FEW (*) NONE SEEN Final   WBC, Wet Prep HPF POC FEW (*) NONE SEEN Final    Studies/Results: Ct Abdomen Pelvis Wo Contrast  05/21/2014   CLINICAL DATA:  Right lower quadrant pain, right flank pain, pain during urination  EXAM: CT ABDOMEN AND PELVIS WITHOUT CONTRAST  TECHNIQUE: Multidetector CT imaging of the abdomen and pelvis was performed  following the standard protocol without IV contrast.  COMPARISON:  None.  FINDINGS: Visualized lung bases clear.  No acute musculoskeletal findings.  Liver is normal. Mild differential attenuation within the gallbladder suggests the possibility of small volume of sludge. Spleen is normal. Pancreas is normal. Adrenal glands are normal. Left kidney is normal. 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 On the right side, there is minimal perinephric inflammatory change. Is a punctate density in the proximal right ureter just a few cm beyond the ureteropelvic junction. There is mild periureteral inflammation down to this level, with  the right ureter beyond this level within normal.  No significant adenopathy. Numerous small nonpathologic retroperitoneal and mediastinal lymph nodes are present.  Stomach, small bowel, large bowel, and appendix are normal. Bladder is normal. Reproductive organs are normal.  IMPRESSION: Minimal perinephric inflammatory change on the right can be seen with pyelonephritis. A can also be related to struck. The proximal 4 cm of the right ureter are minimally prominent with a 1-2 mm punctate density in the ureter at this level possibly a tiny stone.   Electronically Signed   By: Esperanza Heir M.D.   On: 05/21/2014 00:44   Dg Chest Port 1 View  05/20/2014   CLINICAL DATA:  Fever, flank pain.  EXAM: PORTABLE CHEST - 1 VIEW  COMPARISON:  None.  FINDINGS: The heart size and mediastinal contours are within normal limits. Both lungs are clear. The visualized skeletal structures are unremarkable.  IMPRESSION: No active disease.   Electronically Signed   By: Awilda Metro   On: 05/20/2014 23:42    Medications: Scheduled Meds: . cefTRIAXone (ROCEPHIN)  IV  2 g Intravenous Q24H  . enoxaparin (LOVENOX) injection  40 mg Subcutaneous Q24H      LOS: 1 day   RAI,RIPUDEEP M.D. [facility] 05/21/2014, 1:05 PM Pager: [phone]  If 7PM-7AM, please contact night-coverage [email or web address] Password [email or web address]

## 2014-05-22 LAB — CBC
HEMATOCRIT: 32.2 % — AB (ref 36.0–46.0)
HEMOGLOBIN: 10.7 g/dL — AB (ref 12.0–15.0)
MCH: 31.1 pg (ref 26.0–34.0)
MCHC: 33.2 g/dL (ref 30.0–36.0)
MCV: 93.6 fL (ref 78.0–100.0)
Platelets: 143 10*3/uL — ABNORMAL LOW (ref 150–400)
RBC: 3.44 MIL/uL — ABNORMAL LOW (ref 3.87–5.11)
RDW: 13 % (ref 11.5–15.5)
WBC: 12.9 10*3/uL — ABNORMAL HIGH (ref 4.0–10.5)

## 2014-05-22 LAB — URINE CULTURE

## 2014-05-22 LAB — BASIC METABOLIC PANEL
Anion gap: 8 (ref 5–15)
BUN: 3 mg/dL — AB (ref 6–23)
CO2: 25 mEq/L (ref 19–32)
Calcium: 8.1 mg/dL — ABNORMAL LOW (ref 8.4–10.5)
Chloride: 109 mEq/L (ref 96–112)
Creatinine, Ser: 0.59 mg/dL (ref 0.50–1.10)
Glucose, Bld: 86 mg/dL (ref 70–99)
Potassium: 3.5 mEq/L — ABNORMAL LOW (ref 3.7–5.3)
Sodium: 142 mEq/L (ref 137–147)

## 2014-05-22 LAB — GC/CHLAMYDIA PROBE AMP
CT Probe RNA: POSITIVE — AB
GC Probe RNA: NEGATIVE

## 2014-05-22 LAB — B. BURGDORFI ANTIBODIES: B burgdorferi Ab IgG+IgM: 0.65 {ISR}

## 2014-05-22 LAB — ROCKY MTN SPOTTED FVR AB, IGG-BLOOD: RMSF IgG: 0.08 IV

## 2014-05-22 LAB — ROCKY MTN SPOTTED FVR AB, IGM-BLOOD: RMSF IgM: 0.68 IV (ref 0.00–0.89)

## 2014-05-22 MED ORDER — SODIUM CHLORIDE 0.9 % IV BOLUS (SEPSIS)
500.0000 mL | Freq: Once | INTRAVENOUS | Status: AC
Start: 2014-05-22 — End: 2014-05-22
  Administered 2014-05-22: 500 mL via INTRAVENOUS

## 2014-05-22 MED ORDER — VANCOMYCIN HCL IN DEXTROSE 1-5 GM/200ML-% IV SOLN
1000.0000 mg | Freq: Three times a day (TID) | INTRAVENOUS | Status: DC
  Administered 2014-05-22 – 2014-05-24 (×7): 1000 mg via INTRAVENOUS
  Filled 2014-05-22 (×8): qty 200

## 2014-05-22 MED ORDER — ACETAMINOPHEN 500 MG PO TABS
500.0000 mg | ORAL_TABLET | Freq: Once | ORAL | Status: AC
  Administered 2014-05-22: 500 mg via ORAL
  Filled 2014-05-22: qty 1

## 2014-05-22 NOTE — Progress Notes (Signed)
ANTIBIOTIC CONSULT NOTE - INITIAL  Pharmacy Consult for vancomycin Indication: rule out sepsis  Allergies  Allergen Reactions  . Naproxen Hives    Tolerates Ibuprofen    Patient Measurements: Height: 5\' 7"  (170.2 cm) Weight: 206 lb 1.6 oz (93.486 kg) IBW/kg (Calculated) : 61.6  Vital Signs: Temp: 98.1 F (36.7 C) (09/25 0522) Temp src: Oral (09/25 0522) BP: 99/45 mmHg (09/25 0522) Pulse Rate: 69 (09/25 0522)  Labs:  Recent Labs  05/20/14 1918 05/21/14 0420  WBC 16.0* 10.8*  HGB 13.7 11.6*  PLT 221 173  CREATININE 0.72 0.74   Estimated Creatinine Clearance: 123 ml/min (by C-G formula based on Cr of 0.74).   Microbiology: Recent Results (from the past 720 hour(s))  WET PREP, GENITAL     Status: Abnormal   Collection Time    05/20/14 11:35 PM      Result Value Ref Range Status   Yeast Wet Prep HPF POC NONE SEEN  NONE SEEN Final   Trich, Wet Prep NONE SEEN  NONE SEEN Final   Clue Cells Wet Prep HPF POC FEW (*) NONE SEEN Final   WBC, Wet Prep HPF POC FEW (*) NONE SEEN Final  GC/CHLAMYDIA PROBE AMP     Status: Abnormal   Collection Time    05/20/14 11:35 PM      Result Value Ref Range Status   CT Probe RNA POSITIVE (*) NEGATIVE Final   Comment: (NOTE)     A Positive CT or NG Nucleic Acid Amplification Test (NAAT) result     should be considered presumptive evidence of infection.  The result     should be evaluated along with physical examination and other     diagnostic findings.   GC Probe RNA NEGATIVE  NEGATIVE Final   Comment: (NOTE)                                                                                               **Normal Reference Range: Negative**          Assay performed using the Gen-Probe APTIMA COMBO2 (R) Assay.     Acceptable specimen types for this assay include APTIMA Swabs (Unisex,     endocervical, urethral, or vaginal), first void urine, and ThinPrep     liquid based cytology samples.     Performed at [facility] History: Past Medical History  Diagnosis Date  . Arthritis   . UTI (lower urinary tract infection)     Medications:  Prescriptions prior to admission  Medication Sig Dispense Refill  . miconazole nitrate (MONISTAT 3 COMBINATION PACK) 200-2 MG-% (9GM) KIT Place 1 applicator vaginally daily. For 3 days       Scheduled:  . cefTRIAXone (ROCEPHIN)  IV  2 g Intravenous Q24H  . enoxaparin (LOVENOX) injection  40 mg Subcutaneous Q24H  . pneumococcal 23 valent vaccine  0.5 mL Intramuscular Tomorrow-1000   Infusions:  . sodium chloride Stopped (05/21/14 0151)  . sodium chloride 125 mL/hr at 05/22/14 0436    Assessment: 28yo female admitted ~24hr ago for pyelonephritis and possible sepsis, started on  aztreonam that was d/c'd later in the day, now w/ temp 102.7 after APAP, down to 102.5 after ibuprofen, concern for sepsis, to begin vancomycin.  Goal of Therapy:  Vancomycin trough level 15-20 mcg/ml  Plan:  Will begin vancomycin 1033m IV Q8H and monitor CBC, Cx, levels prn.  VWynona Neat PharmD, BCPS  05/22/2014,6:18 AM

## 2014-05-22 NOTE — Progress Notes (Signed)
Patient ID: Kaitlyn Kramer  female  ZOX:[id number]    DOB: December 15, 1985    DOA: 05/20/2014  PCP: No PCP Per Patient  Assessment/Plan: Principal Problem:  SIRS (systemic inflammatory response syndrome) with Pyelonephritis, right flank pain - Overnight was spiking temp, leukocytosis white count trended up to 2.9 today - Will continue IV Rocephin, blood cultures negative so far, urine culture shows 6000 colonies only  - added vancomycin for broad-spectrum antibiotics - Continue IV fluid hydration, Tylenol and Motrin, symptomatic treatment  - d/w Dr Mena Goes, urology as patient continues to have significant right-sided flank pain with fevers could it be due to obstructing stone? Dr. Mena Goes will evaluate the patient, recommended to keep her n.p.o. until then.    Active Problems:   Sepsis -  continue management as #1    Nausea and vomiting - Continue IV antiemetics   DVT Prophylaxis:Lovenox  Code Status:  Family Communication:Patient alert and oriented, discussed with the patient   Disposition:  Consultants:  None  Procedures:  None  Antibiotics:  IV Rocephin  IV vancomycin 9/25 >  Subjective: Patient seen and examined,  still having right flank pain, nausea and vomiting is improving, overnight spiking fevers   Objective: Weight change: 2.495 kg (5 lb 8 oz)  Intake/Output Summary (Last 24 hours) at 05/22/14 1159 Last data filed at 05/22/14 0900  Gross per 24 hour  Intake 3645.25 ml  Output      0 ml  Net 3645.25 ml   Blood pressure 118/64, pulse 80, temperature 97.8 F (36.6 C), temperature source Oral, resp. rate 18, height  (1.702 m), weight 93.486 kg (206 lb 1.6 oz), last menstrual period 04/27/2014, SpO2 100.00%.  Physical Exam: General: Alert and awake, oriented x3, not in any acute distress. CVS: S1-S2 clear, no murmur rubs or gallops Chest: clear to auscultation bilaterally, no wheezing, rales or rhonchi Abdomen: soft nontender, nondistended, normal  bowel sounds , Right CVAT  Extremities: no cyanosis, clubbing or edema noted bilaterally   Lab Results: Basic Metabolic Panel:  Recent Labs Lab 05/21/14 0420 05/22/14 0924  NA 139 142  K 4.5 3.5*  CL 105 109  CO2 23 25  GLUCOSE 87 86  BUN 4* 3*  CREATININE 0.74 0.59  CALCIUM 7.8* 8.1*   Liver Function Tests:  Recent Labs Lab 05/20/14 2322  AST 38*  ALT 53*  ALKPHOS 95  BILITOT 0.8  PROT 6.9  ALBUMIN 3.4*   No results found for this basename: LIPASE, AMYLASE,  in the last 168 hours No results found for this basename: AMMONIA,  in the last 168 hours CBC:  Recent Labs Lab 05/20/14 1918 05/21/14 0420 05/22/14 0924  WBC 16.0* 10.8* 12.9*  NEUTROABS 13.2*  --   --   HGB 13.7 11.6* 10.7*  HCT 39.7 34.5* 32.2*  MCV 90.0 92.0 93.6  PLT 221 173 143*   Cardiac Enzymes: No results found for this basename: CKTOTAL, CKMB, CKMBINDEX, TROPONINI,  in the last 168 hours BNP: No components found with this basename: POCBNP,  CBG: No results found for this basename: GLUCAP,  in the last 168 hours   Micro Results: Recent Results (from the past 240 hour(s))  URINE CULTURE     Status: None   Collection Time    05/20/14  7:25 PM      Result Value Ref Range Status   Specimen Description URINE, RANDOM   Final   Special Requests NONE   Final   Culture  Setup Time  Final   Value: 05/21/2014 09:20     Performed at [facility] Count     Final   Value: 6,000 COLONIES/ML     Performed at [facility]   Culture     Final   Value: INSIGNIFICANT GROWTH     Performed at [facility]   Report Status 05/22/2014 FINAL   Final  CULTURE, BLOOD (ROUTINE X 2)     Status: None   Collection Time    05/20/14 11:15 PM      Result Value Ref Range Status   Specimen Description BLOOD RIGHT HAND   Final   Special Requests BOTTLES DRAWN AEROBIC AND ANAEROBIC 10CC   Final   Culture  Setup Time     Final   Value: 05/21/2014 03:43     Performed at  [facility]   Culture     Final   Value:        BLOOD CULTURE RECEIVED NO GROWTH TO DATE CULTURE WILL BE HELD FOR 5 DAYS BEFORE ISSUING A FINAL NEGATIVE REPORT     Note: Culture results may be compromised due to an excessive volume of blood received in culture bottles.     Performed at [facility]   Report Status PENDING   Incomplete  CULTURE, BLOOD (ROUTINE X 2)     Status: None   Collection Time    05/20/14 11:22 PM      Result Value Ref Range Status   Specimen Description BLOOD LEFT HAND   Final   Special Requests BOTTLES DRAWN AEROBIC ONLY 10CC   Final   Culture  Setup Time     Final   Value: 05/21/2014 03:43     Performed at [facility]   Culture     Final   Value:        BLOOD CULTURE RECEIVED NO GROWTH TO DATE CULTURE WILL BE HELD FOR 5 DAYS BEFORE ISSUING A FINAL NEGATIVE REPORT     Performed at [facility]   Report Status PENDING   Incomplete  WET PREP, GENITAL     Status: Abnormal   Collection Time    05/20/14 11:35 PM      Result Value Ref Range Status   Yeast Wet Prep HPF POC NONE SEEN  NONE SEEN Final   Trich, Wet Prep NONE SEEN  NONE SEEN Final   Clue Cells Wet Prep HPF POC FEW (*) NONE SEEN Final   WBC, Wet Prep HPF POC FEW (*) NONE SEEN Final  GC/CHLAMYDIA PROBE AMP     Status: Abnormal   Collection Time    05/20/14 11:35 PM      Result Value Ref Range Status   CT Probe RNA POSITIVE (*) NEGATIVE Final   Comment: (NOTE)     A Positive CT or NG Nucleic Acid Amplification Test (NAAT) result     should be considered presumptive evidence of infection.  The result     should be evaluated along with physical examination and other     diagnostic findings.   GC Probe RNA NEGATIVE  NEGATIVE Final   Comment: (NOTE)                                                                                               **  Normal Reference Range: Negative**          Assay performed using the Gen-Probe APTIMA COMBO2 (R) Assay.     Acceptable  specimen types for this assay include APTIMA Swabs (Unisex,     endocervical, urethral, or vaginal), first void urine, and ThinPrep     liquid based cytology samples.     Performed at [facility]    Studies/Results: Ct Abdomen Pelvis Wo Contrast  05/21/2014   CLINICAL DATA:  Right lower quadrant pain, right flank pain, pain during urination  EXAM: CT ABDOMEN AND PELVIS WITHOUT CONTRAST  TECHNIQUE: Multidetector CT imaging of the abdomen and pelvis was performed following the standard protocol without IV contrast.  COMPARISON:  None.  FINDINGS: Visualized lung bases clear.  No acute musculoskeletal findings.  Liver is normal. Mild differential attenuation within the gallbladder suggests the possibility of small volume of sludge. Spleen is normal. Pancreas is normal. Adrenal glands are normal. Left kidney is normal. 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 On the right side, there is minimal perinephric inflammatory change. Is a punctate density in the proximal right ureter just a few cm beyond the ureteropelvic junction. There is mild periureteral inflammation down to this level, with the right ureter beyond this level within normal.  No significant adenopathy. Numerous small nonpathologic retroperitoneal and mediastinal lymph nodes are present.  Stomach, small bowel, large bowel, and appendix are normal. Bladder is normal. Reproductive organs are normal.  IMPRESSION: Minimal perinephric inflammatory change on the right can be seen with pyelonephritis. A can also be related to struck. The proximal 4 cm of the right ureter are minimally prominent with a 1-2 mm punctate density in the ureter at this level possibly a tiny stone.   Electronically Signed   By: Esperanza Heir M.D.   On: 05/21/2014 00:44   Dg Chest Port 1 View  05/20/2014   CLINICAL DATA:  Fever, flank pain.  EXAM: PORTABLE CHEST - 1 VIEW  COMPARISON:  None.  FINDINGS: The heart size and mediastinal contours are  within normal limits. Both lungs are clear. The visualized skeletal structures are unremarkable.  IMPRESSION: No active disease.   Electronically Signed   By: Awilda Metro   On: 05/20/2014 23:42    Medications: Scheduled Meds: . cefTRIAXone (ROCEPHIN)  IV  2 g Intravenous Q24H  . enoxaparin (LOVENOX) injection  40 mg Subcutaneous Q24H  . vancomycin  1,000 mg Intravenous 3 times per day      LOS: 2 days   RAI,RIPUDEEP M.D. [facility] 05/22/2014, 11:59 AM Pager: [phone]  If 7PM-7AM, please contact night-coverage [email or web address] Password [email or web address]

## 2014-05-22 NOTE — Progress Notes (Signed)
Dear Doctor: This patient has been identified as a candidate for PICC for the following reason (s): drug pH or osmolality (causing phlebitis, infiltration in 24 hours) If you agree, please write an order for the indicated device. For any questions contact the Vascular Access Team at [phone] if no answer, please leave a message.  Thank you for supporting the early vascular access assessment program. Sophira Rumler M  

## 2014-05-22 NOTE — Progress Notes (Signed)
Pt temp 102.6. Po tylenol given. Craige Cotta, NP paged.  Will follow up temp.

## 2014-05-22 NOTE — Consult Note (Addendum)
Consult: pyelonephritis, right hydronephrosis Requested by: Dr. Tana Coast  History of Present Illness: This is a 28 yo female admitted yesterday with urgency and right sided flank pain x 2 days. She denies dysuria with me. Her UA showed many bacteria but urine Cx only grew ~ 6k growth. White count around 12 today, bun and Cr normal, Procalcitonin normal. She underwent CT scan A/P which revealed minimal perinephric inflammatory change on the right side and a punctate density in the proximal right ureter just a few cm beyond the ureteropelvic junction. There was no hydronephrosis or dilation. There was mild periureteral inflammation down to this level. i reviewed all the images.  UOP not recorded but bun and Cr low so I suspect she is making good UOP. She's had a fluctuating fever curve. He does machine has some frequency and urgency but she was treated for this even as a child. She is voiding with a good stream. Her main complaint this evening is some mild right flank pain which hurts worse with inspiration. Again to confirm she's had no dysuria or hematuria but tells me that her urine does have an odor today.        Past Medical History  Diagnosis Date  . Arthritis   . UTI (lower urinary tract infection)    Past Surgical History  Procedure Laterality Date  . Dilation and curettage of uterus    . Tubal ligation    . Incision and drainage abscess anal      Home Medications:  Prescriptions prior to admission  Medication Sig Dispense Refill  . miconazole nitrate (MONISTAT 3 COMBINATION PACK) 200-2 MG-% (9GM) KIT Place 1 applicator vaginally daily. For 3 days       Allergies:  Allergies  Allergen Reactions  . Naproxen Hives    Tolerates Ibuprofen    History reviewed. No pertinent family history. Social History:  reports that she has been smoking Cigarettes.  She has a 12 pack-year smoking history. She has never used smokeless tobacco. She reports that she does not drink alcohol or use illicit  drugs.  ROS: A complete review of systems was performed.  All systems are negative except for pertinent findings as noted. Review of Systems  All other systems reviewed and are negative.    Physical Exam:  Vital signs in last 24 hours: Temp:  [97.7 F (36.5 C)-102.7 F (39.3 C)] 97.7 F (36.5 C) (09/25 1332) Pulse Rate:  [57-109] 57 (09/25 1332) Resp:  [17-20] 18 (09/25 1332) BP: (91-121)/(45-72) 105/54 mmHg (09/25 1332) SpO2:  [96 %-100 %] 100 % (09/25 1332)  Intake/Output Summary (Last 24 hours) at 05/22/14 1805 Last data filed at 05/22/14 0900  Gross per 24 hour  Intake 3645.25 ml  Output      0 ml  Net 3645.25 ml    Junie Panning, [facility] nurse was chaperone General:  Alert and oriented, No acute distress, walking around the room  HEENT: Normocephalic, atraumatic Neck: No JVD or lymphadenopathy Cardiovascular: Regular rate and rhythm Lungs: Regular rate and effort Abdomen: Soft, nontender, nondistended, no abdominal masses Back: No CVA tenderness Extremities: No edema Neurologic: Grossly intact  Laboratory Data:  Results for orders placed during the hospital encounter of 05/20/14 (from the past 24 hour(s))  CBC     Status: Abnormal   Collection Time    05/22/14  9:24 AM      Result Value Ref Range   WBC 12.9 (*) 4.0 - 10.5 K/uL   RBC 3.44 (*) 3.87 - 5.11 MIL/uL  Hemoglobin 10.7 (*) 12.0 - 15.0 g/dL   HCT 32.2 (*) 36.0 - 46.0 %   MCV 93.6  78.0 - 100.0 fL   MCH 31.1  26.0 - 34.0 pg   MCHC 33.2  30.0 - 36.0 g/dL   RDW 13.0  11.5 - 15.5 %   Platelets 143 (*) 150 - 400 K/uL  BASIC METABOLIC PANEL     Status: Abnormal   Collection Time    05/22/14  9:24 AM      Result Value Ref Range   Sodium 142  137 - 147 mEq/L   Potassium 3.5 (*) 3.7 - 5.3 mEq/L   Chloride 109  96 - 112 mEq/L   CO2 25  19 - 32 mEq/L   Glucose, Bld 86  70 - 99 mg/dL   BUN 3 (*) 6 - 23 mg/dL   Creatinine, Ser 0.59  0.50 - 1.10 mg/dL   Calcium 8.1 (*) 8.4 - 10.5 mg/dL   GFR calc non Af Amer >90   >90 mL/min   GFR calc Af Amer >90  >90 mL/min   Anion gap 8  5 - 15   Recent Results (from the past 240 hour(s))  URINE CULTURE     Status: None   Collection Time    05/20/14  7:25 PM      Result Value Ref Range Status   Specimen Description URINE, RANDOM   Final   Special Requests NONE   Final   Culture  Setup Time     Final   Value: 05/21/2014 09:20     Performed at [place]     Final   Value: 6,000 COLONIES/ML     Performed at [facility]   Culture     Final   Value: INSIGNIFICANT GROWTH     Performed at [facility]   Report Status 05/22/2014 FINAL   Final  CULTURE, BLOOD (ROUTINE X 2)     Status: None   Collection Time    05/20/14 11:15 PM      Result Value Ref Range Status   Specimen Description BLOOD RIGHT HAND   Final   Special Requests BOTTLES DRAWN AEROBIC AND ANAEROBIC 10CC   Final   Culture  Setup Time     Final   Value: 05/21/2014 03:43     Performed at [facility]   Culture     Final   Value:        BLOOD CULTURE RECEIVED NO GROWTH TO DATE CULTURE WILL BE HELD FOR 5 DAYS BEFORE ISSUING A FINAL NEGATIVE REPORT     Note: Culture results may be compromised due to an excessive volume of blood received in culture bottles.     Performed at [facility]   Report Status PENDING   Incomplete  CULTURE, BLOOD (ROUTINE X 2)     Status: None   Collection Time    05/20/14 11:22 PM      Result Value Ref Range Status   Specimen Description BLOOD LEFT HAND   Final   Special Requests BOTTLES DRAWN AEROBIC ONLY 10CC   Final   Culture  Setup Time     Final   Value: 05/21/2014 03:43     Performed at [facility]   Culture     Final   Value:        BLOOD CULTURE RECEIVED NO GROWTH TO DATE CULTURE WILL BE HELD FOR 5 DAYS BEFORE ISSUING  A FINAL NEGATIVE REPORT     Performed at [facility]   Report Status PENDING   Incomplete  WET PREP, GENITAL     Status: Abnormal   Collection Time    05/20/14 11:35  PM      Result Value Ref Range Status   Yeast Wet Prep HPF POC NONE SEEN  NONE SEEN Final   Trich, Wet Prep NONE SEEN  NONE SEEN Final   Clue Cells Wet Prep HPF POC FEW (*) NONE SEEN Final   WBC, Wet Prep HPF POC FEW (*) NONE SEEN Final  GC/CHLAMYDIA PROBE AMP     Status: Abnormal   Collection Time    05/20/14 11:35 PM      Result Value Ref Range Status   CT Probe RNA POSITIVE (*) NEGATIVE Final   Comment: (NOTE)     A Positive CT or NG Nucleic Acid Amplification Test (NAAT) result     should be considered presumptive evidence of infection.  The result     should be evaluated along with physical examination and other     diagnostic findings.   GC Probe RNA NEGATIVE  NEGATIVE Final   Comment: (NOTE)                                                                                               **Normal Reference Range: Negative**          Assay performed using the Gen-Probe APTIMA COMBO2 (R) Assay.     Acceptable specimen types for this assay include APTIMA Swabs (Unisex,     endocervical, urethral, or vaginal), first void urine, and ThinPrep     liquid based cytology samples.     Performed at [facility]   Creatinine:  Recent Labs  05/20/14 1918 05/21/14 0420 05/22/14 0924  CREATININE 0.72 0.74 0.59    Impression/Assessment/plan - agree presentation is c/w pyelonephritis. Bun and CR are normal, so I suspect UOP is normal and if there was obstruction it would be evident with hydronephrosis.  I suspect her flank pain is from pyelonephritis  as inspiration will lower the diaphragm and removed the kidney which could cause some discomfort and an inflamed kidney. Given there is no obstruction on the CT at this point I do'nt believe this small opacity in the ureter is contributing  To her symptoms and I don't believe there is any obstruction at the moment. She has no CVA tenderness on exam and overall looks stable. I did discuss the CT findings with her and the nature risks and  benefits of continued surveillance with medical treatment, right nephrostomy tube and, right ureteral stent. She elected surveillance.   I did order another urine culture given the changes in urine odor today. She may have a regular diet this evening, but I will make her n.p.o. after midnight in the event we need to consider intervention. We will follow.   Festus Aloe 05/22/2014, 5:59 PM

## 2014-05-22 NOTE — Progress Notes (Signed)
Pt temp remained elevated at 102.7 after tylenol given. Po Advil given at 0217, rechecked temp and was 102.5. Craige Cotta, NP paged. Ordered 500 mL bolus of 0.9% NS and 500 mg tylenol. Follow up temp 98.1. Will continue to monitor pt.

## 2014-05-22 NOTE — Care Management Note (Signed)
    Page 1 of 1   05/24/2014     3:02:41 PM CARE MANAGEMENT NOTE 05/24/2014  Patient:  KRYSTIE, LEITER   Account Number:  [id number]  Date Initiated:  05/22/2014  Documentation initiated by:  Tomi Bamberger  Subjective/Objective Assessment:   dx pyelonephritis  admit- lives with daughter.     Action/Plan:   discharge planning   Anticipated DC Date:  05/23/2014   Anticipated DC Plan:  [place]  CM consult      Choice offered to / List presented to:             Status of service:  Completed, signed off Medicare Important Message given?  NO (If response is "NO", the following Medicare IM given date fields will be blank) Date Medicare IM given:   Medicare IM given by:   Date Additional Medicare IM given:   Additional Medicare IM given by:    Discharge Disposition:  HOME/SELF CARE  Per UR Regulation:    If discussed at Long Length of Stay Meetings, dates discussed:    Comments:  05/24/14 09:39 CM met with pt in room to disuss requested [place]; however, pt only prescroibed cipro which is on the $4 list at [facility].  Pt verbalized understaning she may need MATCH at another time and it is best to not have it used for the savings of a few dollars.  CM gave [facility] pamphlet and pt verbalized understaning she is to [place] to CLINIC any weekday morning from 9-10:00 am and request AN APPOINTMENT FOR A PCP; AN APPOINTMENT FOR A NAVIGATOR TO BEGIN THE MEDICAID/INSURANCE PROCESS; AND AN APPOINTMENT FOR FOLLOW UP CARE.  No other CM needs were communicated.  Mariane Masters, Durel Salts [phone].

## 2014-05-23 DIAGNOSIS — N201 Calculus of ureter: Secondary | ICD-10-CM | POA: Diagnosis present

## 2014-05-23 LAB — BASIC METABOLIC PANEL
ANION GAP: 12 (ref 5–15)
BUN: 3 mg/dL — ABNORMAL LOW (ref 6–23)
CO2: 23 mEq/L (ref 19–32)
Calcium: 7.9 mg/dL — ABNORMAL LOW (ref 8.4–10.5)
Chloride: 106 mEq/L (ref 96–112)
Creatinine, Ser: 0.59 mg/dL (ref 0.50–1.10)
Glucose, Bld: 113 mg/dL — ABNORMAL HIGH (ref 70–99)
Potassium: 3.2 mEq/L — ABNORMAL LOW (ref 3.7–5.3)
SODIUM: 141 meq/L (ref 137–147)

## 2014-05-23 LAB — CBC
HCT: 28.4 % — ABNORMAL LOW (ref 36.0–46.0)
Hemoglobin: 9.8 g/dL — ABNORMAL LOW (ref 12.0–15.0)
MCH: 31.6 pg (ref 26.0–34.0)
MCHC: 34.5 g/dL (ref 30.0–36.0)
MCV: 91.6 fL (ref 78.0–100.0)
Platelets: 126 10*3/uL — ABNORMAL LOW (ref 150–400)
RBC: 3.1 MIL/uL — ABNORMAL LOW (ref 3.87–5.11)
RDW: 12.8 % (ref 11.5–15.5)
WBC: 8.6 10*3/uL (ref 4.0–10.5)

## 2014-05-23 LAB — PROCALCITONIN: Procalcitonin: 0.13 ng/mL

## 2014-05-23 MED ORDER — POTASSIUM CHLORIDE CRYS ER 20 MEQ PO TBCR
40.0000 meq | EXTENDED_RELEASE_TABLET | Freq: Two times a day (BID) | ORAL | Status: AC
  Administered 2014-05-23 (×2): 40 meq via ORAL
  Filled 2014-05-23 (×2): qty 2

## 2014-05-23 MED ORDER — LORAZEPAM 1 MG PO TABS
1.0000 mg | ORAL_TABLET | Freq: Once | ORAL | Status: AC
  Administered 2014-05-23: 1 mg via ORAL
  Filled 2014-05-23: qty 1
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
## 2014-05-23 NOTE — Progress Notes (Signed)
Subjective:  1 - Pyelonephritis - Admitted 9/24 with spiking fevers, dysuria c/w likely pyelonephritis. Intake UCX with scant growth, BCX negative. Repeat UCX 9/25 pending. Fever curves trending down on empiric rocephin.   2 - Tiny Right Ureteral Stone - 1-50mm tiny right ureteral stone by CT 04/2014 on eval right flank pain and fever. Slight calyectasis w/o frank hydro. No refractory pain or nausea / emesis. Straining all urien w/o interval passage.   Today Kaitlyn Kramer is stable. Occasional right colicky flank pain that is well controlled on meds. No fevers overnight.   Objective: Vital signs in last 24 hours: Temp:  [97.7 F (36.5 C)-99.9 F (37.7 C)] 97.7 F (36.5 C) (09/26 0549) Pulse Rate:  [57-85] 77 (09/26 0549) Resp:  [18-20] 18 (09/26 0549) BP: (104-126)/(54-80) 104/66 mmHg (09/26 0549) SpO2:  [94 %-100 %] 96 % (09/26 0549) Last BM Date:  (PTA)  Intake/Output from previous day: 09/25 0701 - 09/26 0700 In: 5832.1 [P.O.:2280; I.V.:3102.1; IV Piggyback:450] Out: 200 [Urine:200] Intake/Output this shift:    General appearance: alert, cooperative and appears stated age Head: Normocephalic, without obvious abnormality, atraumatic Nose: Nares normal. Septum midline. Mucosa normal. No drainage or sinus tenderness. Throat: lips, mucosa, and tongue normal; teeth and gums normal Neck: supple, symmetrical, trachea midline Back: mild Rt CVAT Resp: non labored on room air Cardio: Nl rate GI: soft, non-tender; bowel sounds normal; no masses,  no organomegaly Extremities: extremities normal, atraumatic, no cyanosis or edema Pulses: 2+ and symmetric Skin: Skin color, texture, turgor normal. No rashes or lesions Lymph nodes: Cervical, supraclavicular, and axillary nodes normal. Neurologic: Grossly normal  Lab Results:   Recent Labs  05/21/14 0420 05/22/14 0924  WBC 10.8* 12.9*  HGB 11.6* 10.7*  HCT 34.5* 32.2*  PLT 173 143*   BMET  Recent Labs  05/21/14 0420 05/22/14 0924   NA 139 142  K 4.5 3.5*  CL 105 109  CO2 23 25  GLUCOSE 87 86  BUN 4* 3*  CREATININE 0.74 0.59  CALCIUM 7.8* 8.1*   PT/INR No results found for this basename: LABPROT, INR,  in the last 72 hours ABG No results found for this basename: PHART, PCO2, PO2, HCO3,  in the last 72 hours  Studies/Results: No results found.  Anti-infectives: Anti-infectives   Start     Dose/Rate Route Frequency Ordered Stop   05/22/14 0630  vancomycin (VANCOCIN) IVPB 1000 mg/200 mL premix     1,000 mg 200 mL/hr over 60 Minutes Intravenous 3 times per day 05/22/14 0624     05/21/14 2200  cefTRIAXone (ROCEPHIN) 1 g in dextrose 5 % 50 mL IVPB  Status:  Discontinued     1 g 100 mL/hr over 30 Minutes Intravenous Daily at bedtime 05/20/14 2316 05/21/14 0531   05/21/14 2100  cefTRIAXone (ROCEPHIN) 1 g in dextrose 5 % 50 mL IVPB  Status:  Discontinued     1 g 100 mL/hr over 30 Minutes Intravenous Every 24 hours 05/21/14 0209 05/21/14 0531   05/21/14 1800  cefTRIAXone (ROCEPHIN) 2 g in dextrose 5 % 50 mL IVPB     2 g 100 mL/hr over 30 Minutes Intravenous Every 24 hours 05/21/14 1303     05/21/14 0600  aztreonam (AZACTAM) 1 g in dextrose 5 % 50 mL IVPB  Status:  Discontinued     1 g 100 mL/hr over 30 Minutes Intravenous 3 times per day 05/21/14 0531 05/21/14 1304   05/20/14 2315  cefTRIAXone (ROCEPHIN) 2 g in dextrose 5 % 50  mL IVPB     2 g 100 mL/hr over 30 Minutes Intravenous  Once 05/20/14 2310 05/21/14 0027      Assessment/Plan:  1 - Pyelonephritis - Agree with current ABX while repeat CX pending. Certainly rec repeat CX preliminary result and fever-free x 24 hours before discharge.   2 - Tiny Right Ureteral Stone - Very small stone with >90% chance spontaneous passage. Favor medical therapy only unless symtpoms become refracotry or fevers return / persist.   3 - Will follow. Placed back on diet.    [facility], Marjan Rosman 05/23/2014

## 2014-05-23 NOTE — ED Provider Notes (Signed)
Briefly, pt is a 28 y.o. female presenting with flank pain and dysuria concerning for pyelonephritis.  I performed an examination on the patient including cardiac, pulmonary, and gi systems which were unremarkable except for mild R CVA tenderness.  2L NS bolus helped with tachycardia, however pt still ill appearing, so admitted for IV abx.  Coincidentally, the pt has had a rash for the last month without tick exposure or other concerning factors which is lacy and erythematous primarily over superior chest.  Doubt RMSF or Lyme without tick exposure and with appearance of rash.  Admitted in stable condition.    Medical screening examination/treatment/procedure(s) were conducted as a shared visit with non-physician practitioner(s) and myself.  I personally evaluated the patient during the encounter.   EKG Interpretation None        Mirian Mo, MD 05/23/14 [phone]

## 2014-05-24 DIAGNOSIS — N201 Calculus of ureter: Secondary | ICD-10-CM

## 2014-05-24 LAB — URINE CULTURE
CULTURE: NO GROWTH
Colony Count: NO GROWTH

## 2014-05-24 LAB — BASIC METABOLIC PANEL
Anion gap: 12 (ref 5–15)
CALCIUM: 7.9 mg/dL — AB (ref 8.4–10.5)
CO2: 23 meq/L (ref 19–32)
Chloride: 108 mEq/L (ref 96–112)
Creatinine, Ser: 0.56 mg/dL (ref 0.50–1.10)
GFR calc Af Amer: >90 mL/min (ref >90)
GFR calc non Af Amer: >90 mL/min (ref >90)
GLUCOSE: 109 mg/dL — AB (ref 70–99)
Potassium: 3.7 mEq/L (ref 3.7–5.3)
SODIUM: 143 meq/L (ref 137–147)

## 2014-05-24 LAB — CBC
HEMATOCRIT: 27.7 % — AB (ref 36.0–46.0)
HEMOGLOBIN: 9.4 g/dL — AB (ref 12.0–15.0)
MCH: 31.2 pg (ref 26.0–34.0)
MCHC: 33.9 g/dL (ref 30.0–36.0)
MCV: 92 fL (ref 78.0–100.0)
Platelets: 143 10*3/uL — ABNORMAL LOW (ref 150–400)
RBC: 3.01 MIL/uL — AB (ref 3.87–5.11)
RDW: 12.7 % (ref 11.5–15.5)
WBC: 6.3 10*3/uL (ref 4.0–10.5)

## 2014-05-24 MED ORDER — CIPROFLOXACIN HCL 500 MG PO TABS: 500.0000 mg | ORAL_TABLET | Freq: Two times a day (BID) | ORAL | Status: DC

## 2014-05-24 MED ORDER — CIPROFLOXACIN HCL 500 MG PO TABS
500.0000 mg | ORAL_TABLET | Freq: Two times a day (BID) | ORAL | Status: DC
  Administered 2014-05-24: 500 mg via ORAL
  Filled 2014-05-24 (×3): qty 1

## 2014-05-24 MED ORDER — IBUPROFEN 600 MG PO TABS: 600.0000 mg | ORAL_TABLET | Freq: Four times a day (QID) | ORAL | Status: DC | PRN

## 2014-05-24 MED ORDER — DEXTROSE 5 % IV SOLN
2.0000 g | INTRAVENOUS | Status: DC
  Filled 2014-05-24: qty 2

## 2014-05-24 MED ORDER — CEPHALEXIN 500 MG PO CAPS: 500.0000 mg | ORAL_CAPSULE | Freq: Three times a day (TID) | ORAL | Status: DC

## 2014-05-24 MED ORDER — PROMETHAZINE HCL 12.5 MG PO TABS: 12.5000 mg | ORAL_TABLET | Freq: Four times a day (QID) | ORAL | Status: DC | PRN

## 2014-05-24 MED ORDER — OXYCODONE HCL 5 MG PO TABS: 5.0000 mg | ORAL_TABLET | ORAL | Status: DC | PRN

## 2014-05-24 MED ORDER — CEPHALEXIN 500 MG PO CAPS
500.0000 mg | ORAL_CAPSULE | Freq: Three times a day (TID) | ORAL | Status: DC
  Filled 2014-05-24 (×3): qty 1

## 2014-05-24 NOTE — Progress Notes (Signed)
MD notified that DBP is currently running low at 45.  She is alert and oriented.  MD stated that if patient develops lightheadedness or dizziness to check orthostatics and call her back, otherwise just monitor patient.  Daytime RN made aware.  Will continue to monitor patient.
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
## 2014-05-24 NOTE — Progress Notes (Signed)
Subjective:  1 - Pyelonephritis - Admitted 9/24 with spiking fevers, dysuria c/w likely pyelonephritis. Intake UCX with scant growth, BCX negative. Repeat UCX 9/25 negative. Fever curves trending down on empiric rocephin and now afebrile >24 hours.  2 - Tiny Right Ureteral Stone - 1-47mm tiny right ureteral stone by CT 04/2014 on eval right flank pain and fever. Slight calyectasis w/o frank hydro. No refractory pain or nausea / emesis. Straining all urien w/o interval passage. Some mild flank pain now moved lower and some more groin component.   Today Avamae is stable. Occasional right colicky flank pain that is well controlled on meds and has now moved lower and anterior some.   Objective: Vital signs in last 24 hours: Temp:  [96.7 F (35.9 C)-100.1 F (37.8 C)] 99.8 F (37.7 C) (09/27 0557) Pulse Rate:  [62-81] 79 (09/27 0557) Resp:  [18] 18 (09/27 0557) BP: (96-120)/(45-68) 96/45 mmHg (09/27 0557) SpO2:  [94 %-100 %] 98 % (09/27 0557) Last BM Date: 05/20/14  Intake/Output from previous day: 09/26 0701 - 09/27 0700 In: 4215.8 [P.O.:720; I.V.:2845.8; IV Piggyback:650] Out: -  Intake/Output this shift:    General appearance: alert, cooperative and appears stated age Head: Normocephalic, without obvious abnormality, atraumatic Neck: supple, symmetrical, trachea midline Back: Mild Rt very low CVAT Resp: non-labored on room air Chest wall: no tenderness Cardio: Nl rate GI: soft, non-tender; bowel sounds normal; no masses,  no organomegaly Extremities: extremities normal, atraumatic, no cyanosis or edema Pulses: 2+ and symmetric Skin: Skin color, texture, turgor normal. No rashes or lesions Lymph nodes: Cervical, supraclavicular, and axillary nodes normal. Neurologic: Grossly normal  Lab Results:   Recent Labs  05/23/14 0655 05/24/14 0638  WBC 8.6 6.3  HGB 9.8* 9.4*  HCT 28.4* 27.7*  PLT 126* 143*   BMET  Recent Labs  05/23/14 0655 05/24/14 0638  NA 141 143  K  3.2* 3.7  CL 106 108  CO2 23 23  GLUCOSE 113* 109*  BUN 3* <3*  CREATININE 0.59 0.56  CALCIUM 7.9* 7.9*   PT/INR No results found for this basename: LABPROT, INR,  in the last 72 hours ABG No results found for this basename: PHART, PCO2, PO2, HCO3,  in the last 72 hours  Studies/Results: No results found.  Anti-infectives: Anti-infectives   Start     Dose/Rate Route Frequency Ordered Stop   05/24/14 1100  cefTRIAXone (ROCEPHIN) 2 g in dextrose 5 % 50 mL IVPB  Status:  Discontinued     2 g 100 mL/hr over 30 Minutes Intravenous Every 24 hours 05/24/14 0710 05/24/14 0714   05/24/14 0800  ciprofloxacin (CIPRO) tablet 500 mg     500 mg Oral 2 times daily 05/24/14 0714     05/24/14 0000  ciprofloxacin (CIPRO) 500 MG tablet     500 mg Oral 2 times daily 05/24/14 0716     05/22/14 0630  vancomycin (VANCOCIN) IVPB 1000 mg/200 mL premix  Status:  Discontinued     1,000 mg 200 mL/hr over 60 Minutes Intravenous 3 times per day 05/22/14 0624 05/24/14 0659   05/21/14 2200  cefTRIAXone (ROCEPHIN) 1 g in dextrose 5 % 50 mL IVPB  Status:  Discontinued     1 g 100 mL/hr over 30 Minutes Intravenous Daily at bedtime 05/20/14 2316 05/21/14 0531   05/21/14 2100  cefTRIAXone (ROCEPHIN) 1 g in dextrose 5 % 50 mL IVPB  Status:  Discontinued     1 g 100 mL/hr over 30 Minutes Intravenous Every 24  hours 05/21/14 0209 05/21/14 0531   05/21/14 1800  cefTRIAXone (ROCEPHIN) 2 g in dextrose 5 % 50 mL IVPB  Status:  Discontinued     2 g 100 mL/hr over 30 Minutes Intravenous Every 24 hours 05/21/14 1303 05/24/14 0710   05/21/14 0600  aztreonam (AZACTAM) 1 g in dextrose 5 % 50 mL IVPB  Status:  Discontinued     1 g 100 mL/hr over 30 Minutes Intravenous 3 times per day 05/21/14 0531 05/21/14 1304   05/20/14 2315  cefTRIAXone (ROCEPHIN) 2 g in dextrose 5 % 50 mL IVPB     2 g 100 mL/hr over 30 Minutes Intravenous  Once 05/20/14 2310 05/21/14 0027      Assessment/Plan:  1 - Pyelonephritis - Resolved  clinically. Repeat UCX negative suggests appropriate ABX coverage. I feel safe to DC on additional course cephalosportin with 10 day or more total course.   2 - Tiny Right Ureteral Stone - Very small stone with >90% chance spontaneous passage. Favor medical therapy only unless symtpoms become refracotry or fevers return / persist. Her pain moving lower and more anterior is good sign that stone likley moving.   3 - Will follow. If pt is DC'd today, she will need to call [phone] to arrange f/u with Dr. Mena Goes in about 2 weeks at [facility]. We will also try to set up as well.   [facility], [place] Alwin 05/24/2014

## 2014-05-24 NOTE — Progress Notes (Signed)
05/24/14 Patient discharged fro hospital today, IV site removed and discharge instructions revieved with patient.

## 2014-05-26 ENCOUNTER — Emergency Department (INDEPENDENT_AMBULATORY_CARE_PROVIDER_SITE_OTHER)
Admission: EM | Admit: 2014-05-26 | Discharge: 2014-05-26 | Disposition: A | Discharge status: Home / Self Care | Payer: Self-pay | Source: Home / Self Care | Attending: Family Medicine | Admitting: Family Medicine

## 2014-05-26 ENCOUNTER — Emergency Department (INDEPENDENT_AMBULATORY_CARE_PROVIDER_SITE_OTHER): Payer: Self-pay

## 2014-05-26 ENCOUNTER — Encounter (HOSPITAL_COMMUNITY): Payer: Self-pay | Admitting: Emergency Medicine

## 2014-05-26 DIAGNOSIS — A499 Bacterial infection, unspecified: Secondary | ICD-10-CM

## 2014-05-26 DIAGNOSIS — R109 Unspecified abdominal pain: Secondary | ICD-10-CM

## 2014-05-26 DIAGNOSIS — M25539 Pain in unspecified wrist: Secondary | ICD-10-CM

## 2014-05-26 DIAGNOSIS — B9689 Other specified bacterial agents as the cause of diseases classified elsewhere: Secondary | ICD-10-CM

## 2014-05-26 DIAGNOSIS — A749 Chlamydial infection, unspecified: Secondary | ICD-10-CM

## 2014-05-26 DIAGNOSIS — N201 Calculus of ureter: Secondary | ICD-10-CM

## 2014-05-26 DIAGNOSIS — N760 Acute vaginitis: Secondary | ICD-10-CM

## 2014-05-26 DIAGNOSIS — M25532 Pain in left wrist: Secondary | ICD-10-CM

## 2014-05-26 MED ORDER — AZITHROMYCIN 250 MG PO TABS
1000.0000 mg | ORAL_TABLET | Freq: Once | ORAL | Status: AC
  Administered 2014-05-26: 1000 mg via ORAL

## 2014-05-26 MED ORDER — METRONIDAZOLE 500 MG PO TABS: 500.0000 mg | ORAL_TABLET | Freq: Two times a day (BID) | ORAL | Status: DC

## 2014-05-26 MED ORDER — HYDROCODONE-ACETAMINOPHEN 5-325 MG PO TABS: 1.0000 | ORAL_TABLET | Freq: Four times a day (QID) | ORAL | Status: DC | PRN

## 2014-05-26 MED ORDER — AZITHROMYCIN 250 MG PO TABS
ORAL_TABLET | ORAL | Status: AC
Start: 2014-05-26 — End: 2014-05-26
  Filled 2014-05-26: qty 4

## 2014-05-26 NOTE — ED Provider Notes (Signed)
Kaitlyn Kramer is a 28 y.o. female who presents to Urgent Care today for left wrist pain and pelvic pain. Patient was recently discharged from the hospital after suffering a kidney stone with possible pyelonephritis and sepsis. She was treated with vancomycin and ceftriaxone and aztreonam. She was recently discharged from the hospital with oxycodone. She does continue to experience pain. She notes pain in the right flank which is consistent with prior kidney stone pain. She has used oxycodone but states that her and works better. She has an appointment with her urologist in 2 days. No vaginal discharge or bleeding. No urinary symptoms.  She additionally notes left wrist pain. The pain started a few days ago. The wrist pain is worse with motion and is moderate. No fevers or chills nausea vomiting or diarrhea.   Past Medical History  Diagnosis Date  . Arthritis   . UTI (lower urinary tract infection)    History  Substance Use Topics  . Smoking status: Current Every Day Smoker -- 1.00 packs/day for 12 years    Types: Cigarettes  . Smokeless tobacco: Never Used  . Alcohol Use: No   ROS as above Medications: No current facility-administered medications for this encounter.   Current Outpatient Prescriptions  Medication Sig Dispense Refill  . cephALEXin (KEFLEX) 500 MG capsule Take 1 capsule (500 mg total) by mouth 3 (three) times daily. X 11 days  33 capsule  0  . HYDROcodone-acetaminophen (NORCO/VICODIN) 5-325 MG per tablet Take 1 tablet by mouth every 6 (six) hours as needed.  15 tablet  0  . ibuprofen (ADVIL,MOTRIN) 600 MG tablet Take 1 tablet (600 mg total) by mouth every 6 (six) hours as needed for fever or mild pain.  60 tablet  0  . metroNIDAZOLE (FLAGYL) 500 MG tablet Take 1 tablet (500 mg total) by mouth 2 (two) times daily.  14 tablet  0  . miconazole nitrate (MONISTAT 3 COMBINATION PACK) 200-2 MG-% (9GM) KIT Place 1 applicator vaginally daily. For 3 days      . promethazine  (PHENERGAN) 12.5 MG tablet Take 1 tablet (12.5 mg total) by mouth every 6 (six) hours as needed for nausea or vomiting.  30 tablet  0    Exam:  BP 130/74  Pulse 87  Temp(Src) 97.4 F (36.3 C) (Oral)  Resp 16  SpO2 100%  LMP 05/23/2014 Gen: Well NAD HEENT: EOMI,  MMM Lungs: Normal work of breathing. CTABL Heart: RRR no MRG Abd: NABS, Soft. Nondistended, Nontender no CV angle tenderness to percussion Exts: Brisk capillary refill, warm and well perfused.  Left wrist: Normal-appearing. Tender palpation along the radial styloid. Mildly positive Finkelstein's test. Motion is intact. Pulses capillary refill and sensation are intact.   Review of laboratory findings:  Urine culture was negative Chlamydia was positive  No results found for this or any previous visit (from the past 24 hour(s)). Dg Wrist Complete Left  05/26/2014   CLINICAL DATA:  Left wrist pain with lifting heavy objects; onset today; no history of injury  EXAM: LEFT WRIST - COMPLETE 3+ VIEW  COMPARISON:  None.  FINDINGS: The bones of the wrist are adequately mineralized. There is no acute or old fracture or dislocation. There is a tiny bony density along the lateral aspect of the radiocarpal joint that likely reflects a tiny accessory ossification center versus soft tissue calcification. There is no significant degenerative change. The soft tissues are unremarkable.  IMPRESSION: There is no acute or significant chronic bony abnormality of the left  wrist. If further imaging is desired, MRI would be a more sensitive modality for assessment of the soft tissues.   Electronically Signed   By: David  Martinique   On: 05/26/2014 13:22    Assessment and Plan: 28 y.o. female with  1) right flank pain: Likely due to kidney stone. Norco. Followup with urology. 2) wrist pain: Tendinitis. Likely de Quervain's tenosynovitis. Thumb Spica splint. Followup with PCP. 3) Chlamydia: Treatment with azithromycin 1 g here. Sexual partner should  followup.  4) BV: Flagyl  Discussed warning signs or symptoms. Please see discharge instructions. Patient expresses understanding.     Gregor Hams, MD 05/26/14 [phone]

## 2014-05-26 NOTE — ED Notes (Signed)
Pt triaged and assessed by provider.   Provider in before nurse. 

## 2014-05-26 NOTE — Discharge Instructions (Signed)
Thank you for coming in today. Followup with your urologist as soon as possible.  Take Norco instead of oxycodone for pain.  Use the wrist splint for wrist pain.   You also have Chlamydia that has not been treated until today. We did give the azithromycin which will treat Chlamydia. Do not have unprotected sex for at least one week. If sexual partner also need to be tested and treated. Additionally take Flagyl twice daily for one week.  Followup with [facility].  [facility] [address] [place], [place] [id number] [phone]  Please call or see Ms Antionette CharMaggy Mena for assistance with your bill.  You may qualify for reduced or free services.  Her phone number is [phone]. Her email is [email or web address]  Kidney Stones Kidney stones (urolithiasis) are deposits that form inside your kidneys. The intense pain is caused by the stone moving through the urinary tract. When the stone moves, the ureter goes into spasm around the stone. The stone is usually passed in the urine.  CAUSES   A disorder that makes certain neck glands produce too much parathyroid hormone (primary hyperparathyroidism).  A buildup of uric acid crystals, similar to gout in your joints.  Narrowing (stricture) of the ureter.  A kidney obstruction present at birth (congenital obstruction).  Previous surgery on the kidney or ureters.  Numerous kidney infections. SYMPTOMS   Feeling sick to your stomach (nauseous).  Throwing up (vomiting).  Blood in the urine (hematuria).  Pain that usually spreads (radiates) to the groin.  Frequency or urgency of urination. DIAGNOSIS   Taking a history and physical exam.  Blood or urine tests.  CT scan.  Occasionally, an examination of the inside of the urinary bladder (cystoscopy) is performed. TREATMENT   Observation.  Increasing your fluid intake.  Extracorporeal shock  wave lithotripsy--This is a noninvasive procedure that uses shock waves to break up kidney stones.  Surgery may be needed if you have severe pain or persistent obstruction. There are various surgical procedures. Most of the procedures are performed with the use of small instruments. Only small incisions are needed to accommodate these instruments, so recovery time is minimized. The size, location, and chemical composition are all important variables that will determine the proper choice of action for you. Talk to your health care provider to better understand your situation so that you will minimize the risk of injury to yourself and your kidney.  HOME CARE INSTRUCTIONS   Drink enough water and fluids to keep your urine clear or pale yellow. This will help you to pass the stone or stone fragments.  Strain all urine through the provided strainer. Keep all particulate matter and stones for your health care provider to see. The stone causing the pain may be as small as a grain of salt. It is very important to use the strainer each and every time you pass your urine. The collection of your stone will allow your health care provider to analyze it and verify that a stone has actually passed. The stone analysis will often identify what you can do to reduce the incidence of recurrences.  Only take over-the-counter or prescription medicines for pain, discomfort, or fever as directed by your health care provider.  Make a follow-up appointment with your health care provider as directed.  Get follow-up X-rays if required. The absence of pain does not always mean that the stone has passed. It may have only stopped moving. If the  urine remains completely obstructed, it can cause loss of kidney function or even complete destruction of the kidney. It is your responsibility to make sure X-rays and follow-ups are completed. Ultrasounds of the kidney can show blockages and the status of the kidney. Ultrasounds are not  associated with any radiation and can be performed easily in a matter of minutes. SEEK MEDICAL CARE IF:  You experience pain that is progressive and unresponsive to any pain medicine you have been prescribed. SEEK IMMEDIATE MEDICAL CARE IF:   Pain cannot be controlled with the prescribed medicine.  You have a fever or shaking chills.  The severity or intensity of pain increases over 18 hours and is not relieved by pain medicine.  You develop a new onset of abdominal pain.  You feel faint or pass out.  You are unable to urinate. MAKE SURE YOU:   Understand these instructions.  Will watch your condition.  Will get help right away if you are not doing well or get worse. Document Released: 08/14/2005 Document Revised: 04/16/2013 Document Reviewed: 01/15/2013 [facility] Patient Information 2015 [place], [place]. This information is not intended to replace advice given to you by your health care provider. Make sure you discuss any questions you have with your health care provider.   \Chlamydia Chlamydia is an infection. It is spread through sexual contact. Chlamydia can be in different areas of the body. These areas include the cervix, urethra, throat, or rectum. You may not know you have chlamydia because many people never develop the symptoms. Chlamydia is not difficult to treat once you know you have it. However, if it is left untreated, chlamydia can lead to more serious health problems.  CAUSES  Chlamydia is caused by bacteria. It is a sexually transmitted disease. It is passed from an infected partner during intimate contact. This contact could be with the genitals, mouth, or rectal area. Chlamydia can also be passed from mothers to babies during birth. SIGNS AND SYMPTOMS  There may not be any symptoms. This is often the case early in the infection. If symptoms develop, they may include:  Mild pain and discomfort when urinating.  Redness, soreness, and swelling (inflammation) of the  rectum.  Vaginal discharge.  Painful intercourse.  Abdominal pain.  Bleeding between menstrual periods. DIAGNOSIS  To diagnose this infection, your health care provider will do a pelvic exam. Cultures will be taken of the vagina, cervix, urine, and possibly the rectum to verify the diagnosis.  TREATMENT You will be given antibiotic medicines. If you are pregnant, certain types of antibiotics will need to be avoided. Any sexual partners should also be treated, even if they do not show symptoms.  HOME CARE INSTRUCTIONS   Take your antibiotic medicine as directed by your health care provider. Finish the antibiotic even if you start to feel better.  Take medicines only as directed by your health care provider.  Inform any sexual partners about the infection. They should also be treated.  Do not have sexual contact until your health care provider tells you it is okay.  Get plenty of rest.  Eat a well-balanced diet.  Drink enough fluids to keep your urine clear or pale yellow.  Keep all follow-up visits as directed by your health care provider. SEEK MEDICAL CARE IF:  You have painful urination.  You have abdominal pain.  You have vaginal discharge.  You have painful sexual intercourse.  You have bleeding between periods and after sex.  You have a fever. SEEK IMMEDIATE MEDICAL  CARE IF:   You experience nausea or vomiting.  You experience excessive sweating (diaphoresis).  You have difficulty swallowing. MAKE SURE YOU:   Understand these instructions.  Will watch your condition.  Will get help right away if you are not doing well or get worse. Document Released: 05/24/2005 Document Revised: 12/29/2013 Document Reviewed: 04/21/2013 [facility] Patient Information 2015 [place], [place]. This information is not intended to replace advice given to you by your health care provider. Make sure you discuss any questions you have with your health care provider.  Bacterial  Vaginosis Bacterial vaginosis is a vaginal infection that occurs when the normal balance of bacteria in the vagina is disrupted. It results from an overgrowth of certain bacteria. This is the most common vaginal infection in women of childbearing age. Treatment is important to prevent complications, especially in pregnant women, as it can cause a premature delivery. CAUSES  Bacterial vaginosis is caused by an increase in harmful bacteria that are normally present in smaller amounts in the vagina. Several different kinds of bacteria can cause bacterial vaginosis. However, the reason that the condition develops is not fully understood. RISK FACTORS Certain activities or behaviors can put you at an increased risk of developing bacterial vaginosis, including:  Having a new sex partner or multiple sex partners.  Douching.  Using an intrauterine device (IUD) for contraception. Women do not get bacterial vaginosis from toilet seats, bedding, swimming pools, or contact with objects around them. SIGNS AND SYMPTOMS  Some women with bacterial vaginosis have no signs or symptoms. Common symptoms include:  Grey vaginal discharge.  A fishlike odor with discharge, especially after sexual intercourse.  Itching or burning of the vagina and vulva.  Burning or pain with urination. DIAGNOSIS  Your health care provider will take a medical history and examine the vagina for signs of bacterial vaginosis. A sample of vaginal fluid may be taken. Your health care provider will look at this sample under a microscope to check for bacteria and abnormal cells. A vaginal pH test may also be done.  TREATMENT  Bacterial vaginosis may be treated with antibiotic medicines. These may be given in the form of a pill or a vaginal cream. A second round of antibiotics may be prescribed if the condition comes back after treatment.  HOME CARE INSTRUCTIONS   Only take over-the-counter or prescription medicines as directed by your  health care provider.  If antibiotic medicine was prescribed, take it as directed. Make sure you finish it even if you start to feel better.  Do not have sex until treatment is completed.  Tell all sexual partners that you have a vaginal infection. They should see their health care provider and be treated if they have problems, such as a mild rash or itching.  Practice safe sex by using condoms and only having one sex partner. SEEK MEDICAL CARE IF:   Your symptoms are not improving after 3 days of treatment.  You have increased discharge or pain.  You have a fever. MAKE SURE YOU:   Understand these instructions.  Will watch your condition.  Will get help right away if you are not doing well or get worse. FOR MORE INFORMATION  Centers for Disease Control and Prevention, Division of STD Prevention: [employer] American Sexual Health Association (ASHA): www.ashastd.org  Document Released: 08/14/2005 Document Revised: 06/04/2013 Document Reviewed: 03/26/2013 [facility] Patient Information 2015 [place], [place]. This information is not intended to replace advice given to you by your health care provider. Make sure you discuss  any questions you have with your health care provider. ° °

## 2014-05-27 LAB — CULTURE, BLOOD (ROUTINE X 2)
CULTURE: NO GROWTH
Culture: NO GROWTH

## 2015-05-11 ENCOUNTER — Encounter (HOSPITAL_COMMUNITY): Payer: Self-pay | Admitting: Vascular Surgery

## 2015-05-11 ENCOUNTER — Emergency Department (HOSPITAL_COMMUNITY)
Admission: EM | Admit: 2015-05-11 | Discharge: 2015-05-11 | Disposition: A | Discharge status: Home / Self Care | Payer: Medicaid Other | Attending: Emergency Medicine | Admitting: Emergency Medicine

## 2015-05-11 DIAGNOSIS — Z87442 Personal history of urinary calculi: Secondary | ICD-10-CM | POA: Diagnosis not present

## 2015-05-11 DIAGNOSIS — K088 Other specified disorders of teeth and supporting structures: Secondary | ICD-10-CM | POA: Diagnosis present

## 2015-05-11 DIAGNOSIS — M1990 Unspecified osteoarthritis, unspecified site: Secondary | ICD-10-CM | POA: Diagnosis not present

## 2015-05-11 DIAGNOSIS — Z79899 Other long term (current) drug therapy: Secondary | ICD-10-CM | POA: Insufficient documentation

## 2015-05-11 DIAGNOSIS — Z8659 Personal history of other mental and behavioral disorders: Secondary | ICD-10-CM | POA: Insufficient documentation

## 2015-05-11 DIAGNOSIS — K0889 Other specified disorders of teeth and supporting structures: Secondary | ICD-10-CM

## 2015-05-11 DIAGNOSIS — Z720 Tobacco use: Secondary | ICD-10-CM | POA: Insufficient documentation

## 2015-05-11 DIAGNOSIS — Z87440 Personal history of urinary (tract) infections: Secondary | ICD-10-CM | POA: Diagnosis not present

## 2015-05-11 DIAGNOSIS — Z8639 Personal history of other endocrine, nutritional and metabolic disease: Secondary | ICD-10-CM | POA: Insufficient documentation

## 2015-05-11 HISTORY — DX: Anxiety disorder, unspecified: F41.9

## 2015-05-11 HISTORY — DX: Hypokalemia: E87.6

## 2015-05-11 HISTORY — DX: Calculus of kidney: N20.0

## 2015-05-11 NOTE — ED Provider Notes (Signed)
CSN: [id number]     Arrival date & time 05/11/15  1924 History  This chart was scribed for Kaitlyn Hedges, PA-C, working with Jon Knapp, MD by Garrett Cook, ED Scribe. This patient was seen in room TR08C/TR08C and the patient's care was started at 8:53 PM.   Chief Complaint  Patient presents with  . Dental Pain   The history is provided by the patient. No language interpreter was used.   HPI Comments: Kaitlyn Kramer is a 29 y.o. female who presents to the Emergency Department complaining of constant, aching, moderate-severe left lower dental pain radiating throughout the left jaw onset yesterday, worse with chewing.  She has taken hydrocodone without relief.  She reports no significant dental hx with the exception of a right lower extraction.  Patient denies fever, chills, neck stiffness, obvious intraoral swelling.   Past Medical History  Diagnosis Date  . Arthritis   . UTI (lower urinary tract infection)   . Nephrolithiasis   . Hypokalemia   . Anxiety    Past Surgical History  Procedure Laterality Date  . Dilation and curettage of uterus    . Tubal ligation    . Incision and drainage abscess anal     No family history on file. Social History  Substance Use Topics  . Smoking status: Current Every Day Smoker -- 1.00 packs/day for 12 years    Types: Cigarettes  . Smokeless tobacco: Never Used  . Alcohol Use: No   OB History    No data available     Review of Systems  All other systems reviewed and are negative.   Allergies  Naproxen  Home Medications   Prior to Admission medications   Medication Sig Start Date End Date Taking? Authorizing Provider  cephALEXin (KEFLEX) 500 MG capsule Take 1 capsule (500 mg total) by mouth 3 (three) times daily. X 11 days 05/24/14   Ripudeep K Rai, MD  HYDROcodone-acetaminophen (NORCO/VICODIN) 5-325 MG per tablet Take 1 tablet by mouth every 6 (six) hours as needed. 05/26/14   Evan S Corey, MD  ibuprofen (ADVIL,MOTRIN) 600 MG tablet Take 1  tablet (600 mg total) by mouth every 6 (six) hours as needed for fever or mild pain. 05/24/14   Ripudeep K Rai, MD  metroNIDAZOLE (FLAGYL) 500 MG tablet Take 1 tablet (500 mg total) by mouth 2 (two) times daily. 05/26/14   Evan S Corey, MD  miconazole nitrate (MONISTAT 3 COMBINATION PACK) 200-2 MG-% (9GM) KIT Place 1 applicator vaginally daily. For 3 days    Historical Provider, MD  promethazine (PHENERGAN) 12.5 MG tablet Take 1 tablet (12.5 mg total) by mouth every 6 (six) hours as needed for nausea or vomiting. 05/24/14   Ripudeep K Rai, MD   BP 128/74 mmHg  Pulse 71  Temp(Src) 98.1 F (36.7 C) (Oral)  Resp 18  SpO2 100% Physical Exam  Constitutional: She is oriented to person, place, and time. She appears well-developed and well-nourished. No distress.  HENT:  Head: Normocephalic.  Mouth/Throat: Uvula is midline, oropharynx is clear and moist and mucous membranes are normal. No oropharyngeal exudate, posterior oropharyngeal edema, posterior oropharyngeal erythema or tonsillar abscesses.  External exam shows no asymmetry of the jaw line or face, no signs of obvious swelling, edema, infection. Full active range of motion of the jaw. Neck is supple with full active range of motion, no tenderness to palpation of the soft tissues  Gumline palpated no obvious signs of infection including warmth, redness, abscess, tenderness. Posterior oropharynx   clear with no signs of infection, uvula is midline and rises with phonation, tonsils present and normal in size, symmetrical bilateral, tongue is normal soft touch with full active range of motion, floor mouth is soft nontender.  Eyes: Conjunctivae are normal. Pupils are equal, round, and reactive to light. Right eye exhibits no discharge. Left eye exhibits no discharge.  Neck: Normal range of motion. Neck supple. No JVD present. No tracheal deviation present. No thyromegaly present.  Pulmonary/Chest: No stridor.  Lymphadenopathy:    She has no cervical  adenopathy.  Neurological: She is alert and oriented to person, place, and time.  Skin: Skin is warm and dry. No rash noted. She is not diaphoretic. No erythema. No pallor.  Psychiatric: She has a normal mood and affect. Her behavior is normal. Judgment and thought content normal.  Nursing note and vitals reviewed.   ED Course  Procedures (including critical care time)  Labs Review Labs Reviewed - No data to display  Imaging Review No results found. I have personally reviewed and evaluated these images and lab results as part of my medical decision-making.   EKG Interpretation None      Fractured left mandibular canine.   MDM   Final diagnoses:  Pain, dental   Labs:   Imaging:  Therapeutics: Dental block  Assessment/Plan: Patient presents with an uncomplicated dental pain, dental block here did not improve symptoms. No signs of infection. Patient advised to alternate ibuprofen/Tylenol and f/u with a dentist.    Discharge Meds:    I personally performed the services described in this documentation, which was scribed in my presence. The recorded information has been reviewed and is accurate.    Okey Regal, PA-C 05/14/15 1559  Dorie Rank, MD 05/17/15 [phone]

## 2015-05-11 NOTE — ED Notes (Signed)
Pt reports to the ED for eval of dental pain. She has hx of dental problems but it has been stable until yesterday she developed some pain to a tooth in her left lower jaw. Airway intact. Pt A&Ox4, resp e/u, and skin warm and dry.

## 2015-05-11 NOTE — Discharge Instructions (Signed)
Dental Pain °A tooth ache may be caused by cavities (tooth decay). Cavities expose the nerve of the tooth to air and hot or cold temperatures. It may come from an infection or abscess (also called a boil or furuncle) around your tooth. It is also often caused by dental caries (tooth decay). This causes the pain you are having. °DIAGNOSIS  °Your caregiver can diagnose this problem by exam. °TREATMENT  °· If caused by an infection, it may be treated with medications which kill germs (antibiotics) and pain medications as prescribed by your caregiver. Take medications as directed. °· Only take over-the-counter or prescription medicines for pain, discomfort, or fever as directed by your caregiver. °· Whether the tooth ache today is caused by infection or dental disease, you should see your dentist as soon as possible for further care. °SEEK MEDICAL CARE IF: °The exam and treatment you received today has been provided on an emergency basis only. This is not a substitute for complete medical or dental care. If your problem worsens or new problems (symptoms) appear, and you are unable to meet with your dentist, call or return to this location. °SEEK IMMEDIATE MEDICAL CARE IF:  °· You have a fever. °· You develop redness and swelling of your face, jaw, or neck. °· You are unable to open your mouth. °· You have severe pain uncontrolled by pain medicine. °MAKE SURE YOU:  °· Understand these instructions. °· Will watch your condition. °· Will get help right away if you are not doing well or get worse. °Document Released: 08/14/2005 Document Revised: 11/06/2011 Document Reviewed: 04/01/2008 °ExitCare® Patient Information ©2015 [employer]. This information is not intended to replace advice given to you by your health care provider. Make sure you discuss any questions you have with your health care provider. ° °Emergency Department Resource Guide °1) Find a Doctor and Pay Out of Pocket °Although you won't have to find out who  is covered by your insurance plan, it is a good idea to ask around and get recommendations. You will then need to call the office and see if the doctor you have chosen will accept you as a new patient and what types of options they offer for patients who are self-pay. Some doctors offer discounts or will set up payment plans for their patients who do not have insurance, but you will need to ask so you aren't surprised when you get to your appointment. ° °2) Contact Your Local Health Department °Not all health departments have doctors that can see patients for sick visits, but many do, so it is worth a call to see if yours does. If you don't know where your local health department is, you can check in your phone book. The CDC also has a tool to help you locate your state's health department, and many state websites also have listings of all of their local health departments. ° °3) Find a Walk-in Clinic °If your illness is not likely to be very severe or complicated, you may want to try a walk in clinic. These are popping up all over the country in pharmacies, drugstores, and shopping centers. They're usually staffed by nurse practitioners or physician assistants that have been trained to treat common illnesses and complaints. They're usually fairly quick and inexpensive. However, if you have serious medical issues or chronic medical problems, these are probably not your best option. ° °No Primary Care Doctor: °- Call [facility] at  832-8000 - they can help you locate a primary   care doctor that  accepts your insurance, provides certain services, etc. °- Physician Referral Service- 1-800-533-3463 ° °Chronic Pain Problems: °Organization         Address  Phone   Notes  °[facility]  (336) 297-2271 Patients need to be referred by their primary care doctor.  ° °Medication Assistance: °Organization         Address  Phone   Notes  °[place] [facility] [address]  [address] °[place], [place] [address] (336) 641-8030 --Must be a resident of [place] °-- Must have NO insurance coverage whatsoever (no Medicaid/ Medicare, etc.) °-- The pt. MUST have a primary care doctor that directs their care regularly and follows them in the community °  °[facility]  (866) 331-1348   °[employer]  (888) 892-1162   ° °Agencies that provide inexpensive medical care: °Organization         Address  Phone   Notes  °[facility]  (336) 832-8035   °[facility]    (336) 832-7272   °[facility] [address] °[place], [place] [address] (336) 832-4777   °[facility] [address], °[place] (336) 271-4999   °[facility]    (336) 373-0678   °[facility]    (336) 272-1050   °[facility] ° [address], [place] Phone:  (336) 832-4444, Fax:  (336) 832-4440 Hours of Operation:  9 am - 6 pm, M-F.  Also accepts Medicaid/Medicare and self-pay.  °[facility] ° [address], [place] Phone: (336) 832-3150, Fax: (336) 832-3151. Hours of Operation:  8:30 am - 5:30 pm, M-F.  Also accepts Medicaid and self-pay.  °[facility] [place] [address], [place] Phone: (336) 878-6027   °[facility] [address], [place], [place] (336)723-1848, Ext. 123 Mondays & Thursdays: 7-9 AM.  First 15 patients are seen on a first come, first serve basis. °  ° °Medicaid-accepting [place] Providers: ° °Organization         Address  Phone   Notes  °[facility] [address], [place] (336) 641-2100 Also accepts self-pay patients.  °[facility] [address], [place] ° (336) 856-9996   °[facility] [address], [place] (336) 288-8857   °[facility] [address] [place] [address], [place] (336) 299-7000   °Veita Bland [address], [place]  ° (336) 373-1557 Only accepts [place] Access Medicaid patients after they have their name applied to their card.  ° °Self-Pay (no insurance) in [place]: ° °Organization         Address  Phone   Notes  °Sickle Cell Patients, [facility] [address], [place] (336) 832-1970   °[facility] [address], [place] (336) 832-4400   °[facility] ° [address], [place] (336) 992-4800   °[facility]/Dr. Osei-Bonsu ° [address] [place] [address], [place] or [address], [place] (336) 841-8500 Phone number for both [place] and [place] locations is the same.  °[facility] [address], [place] (336) 299-0000   °[facility] [address] [place] [address], [place] or [address] (336) 852-7530 °(336) 878-2260   °  [facility] [address], [place] (336) 350-1642, phone; (336) 294-5005, fax Sees patients 1st and 3rd Saturday of every month.  Must not qualify for public or private insurance (i.e. Medicaid, Medicare, [place] Health Choice, Veterans' Benefits) • Household income should be no more than 200% of the poverty level •The clinic cannot treat you if you are pregnant or think you are pregnant • Sexually transmitted diseases are not treated at the clinic.  ° ° °Dental Care: °Organization         Address  Phone  Notes  °[facility] [address], [place] (336) 641-6152 Accepts children up to age 21 who are enrolled in Medicaid or [place] Health Choice; pregnant women with a Medicaid card; and children who have applied for Medicaid or [place] Health Choice, but were declined, whose parents can pay a reduced fee at time of service.  °[facility] [place]  [address], [place] (336) 641-7733 Accepts children up to age 21 who are  enrolled in Medicaid or [place] Health Choice; pregnant women with a Medicaid card; and children who have applied for Medicaid or [place] Health Choice, but were declined, whose parents can pay a reduced fee at time of service.  °[facility] ° [address], [place] (336) 641-4533 Patients are seen by appointment only. Walk-ins are not accepted. [facility] will see patients 18 years of age and older. °Monday - Tuesday (8am-5pm) °Most Wednesdays (8:30-5pm) °$30 per visit, cash only  °[facility] ° [address], [place] (336) 641-4533 Patients are seen by appointment only. Walk-ins are not accepted. [facility] will see patients 18 years of age and older. °One Wednesday Evening (Monthly: Volunteer Based).  $30 per visit, cash only  °[facility]  (919) 537-3737 for adults; Children under age 4, call Graduate Pediatric Dentistry at (919) [phone]. Children aged 4-14, please call (919) [phone] to request a pediatric application. ° Dental services are provided in all areas of dental care including fillings, crowns and bridges, complete and partial dentures, implants, gum treatment, root canals, and extractions. Preventive care is also provided. Treatment is provided to both adults and children. °Patients are selected via a lottery and there is often a waiting list. °  °[facility] [address], °[place] ° (336) 763-8833 [email or web address] °  °[facility] [address], [place], [place] (336)723-1848, Ext. 123 Second and Fourth Thursday of each month, opens at 6:30 AM; Clinic ends at 9 AM.  Patients are seen on a first-come first-served basis, and a limited number are seen during each clinic.  ° °[facility] ° [address], [place], [place] (336) 723-7904   Eligibility Requirements °You must have lived in [place], or [place] counties for at least the last three months. °  You  cannot be eligible for state or federal sponsored healthcare insurance, including Veterans Administration, Medicaid, or Medicare. °  You generally cannot be eligible for healthcare insurance through your employer.  °  How to apply: °Eligibility screenings are held every Tuesday and Wednesday afternoon from 1:00 pm until 4:00 pm. You do not need an appointment for the interview!  °[facility] [address], [place], [place] 336-631-2330   °[facility]  336-342-8273   °[facility]  336-703-3100   °[facility]  336-570-6415   ° °Behavioral Health Resources in the Community: °Intensive Outpatient Programs °Organization         Address  Phone  Notes  °[facility] [address], [place], [place] 336-878-6098   °[facility] [address], [place], [place] 336-832-9800   °[facility] [address], [place], [place] ° 336-882-2125   °[facility] [address],  °[place], [place] 1-800-853-5163 or 336-641-4981   °Substance Abuse Resources °Organization         Address  Phone  Notes  °[facility]  336-882-2125   °[facility]  336-784-9470   °[facility]  336-285-9073   °[facility]  336-845-3988   °Residential & Outpatient Substance Abuse Program  1-800-659-3381   °Psychological Services °Organization         Address  Phone  Notes  °[facility]  336- 832-9600   °[facility]  336- 378-7881   °[facility] [address], [place] 1-800-853-5163 or 336-641-4981   ° °Mobile Crisis Teams °Organization         Address  Phone  Notes  °[facility], Mobile Crisis Care Unit  1-877-626-1772   °Assertive °[facility] ° [address] [place], [place] 336-834-9664   °Sharon DeEsch [address] °[place] 336-554-5454   ° °Self-Help/Support  Groups °Organization         Address  Phone             Notes  °[facility] [place] - variety of support groups  336- 373-1402 Call for more information  °Narcotics Anonymous (NA), [facility] [address], °[place]  2 meetings at this location  ° °Residential Treatment Programs °Organization         Address  Phone  Notes  °[facility] [address],    °[place]  1-866-801-8205   °[facility] ° [address], [place], [place] 704-293-8524   °[facility] [address], [place] 336-845-3988 Admissions: 8am-3pm M-F  °[facility] [address]    °[place], [place] 336-841-1104   °[facility] [address], [place], [place] 336-379-7146   °[facility] [address]  °[place], [place] 336-285-9073   °[facility] Intensive Outpatient [address], [place], [place] 336-852-3033   °[facility] [address]  °[place], [place] 1-877-615-2722 or 336-784-9470   °[facility] [address] [place], [place] 336-227-7417 Accepts Medicaid  °[facility] [address]  ° [place] 1-800-659-3381 Substance Abuse/Addiction Treatment  ° °[place] Behavioral Health Resources °Organization         Address  Phone  Notes  °[facility]  (888) 581-9988   °Julie Brannon, PhD [address] [place], [place]   (336) 349-5553 or (336) 951-0000   °[facility]   [address] °[place], [place] (336) 349-4454   °[facility] [address], [place], [place] (336) 342-8316 Insurance/Medicaid/sponsorship through [facility]  °[facility] [address]                                    [place],  (336) 342-8316 Therapy/tele-psych/case  °[facility]   [address]  ° [place], [place] (336) 349-2233    °Dr. Arfeen  (336) 349-4544   °[facility] [place]  [facility]  [employer] [facility] [place] [facility] 1) [address], [place] °2) [address], [place] °3)  [address], [place] (336) 349-3220 °(336) 342-7768 ° °(336) 342-8140   °[place] [facility] (336) 342-1394 or (336) 342-3537 (After Hours)    ° ° ° °

## 2015-08-13 ENCOUNTER — Emergency Department (HOSPITAL_COMMUNITY)
Admission: EM | Admit: 2015-08-13 | Discharge: 2015-08-13 | Disposition: A | Discharge status: Home / Self Care | Payer: Medicaid Other | Attending: Emergency Medicine | Admitting: Emergency Medicine

## 2015-08-13 ENCOUNTER — Encounter (HOSPITAL_COMMUNITY): Payer: Self-pay | Admitting: Emergency Medicine

## 2015-08-13 DIAGNOSIS — M1990 Unspecified osteoarthritis, unspecified site: Secondary | ICD-10-CM | POA: Diagnosis not present

## 2015-08-13 DIAGNOSIS — Z792 Long term (current) use of antibiotics: Secondary | ICD-10-CM | POA: Insufficient documentation

## 2015-08-13 DIAGNOSIS — R109 Unspecified abdominal pain: Secondary | ICD-10-CM | POA: Insufficient documentation

## 2015-08-13 DIAGNOSIS — M545 Low back pain, unspecified: Secondary | ICD-10-CM

## 2015-08-13 DIAGNOSIS — Z3202 Encounter for pregnancy test, result negative: Secondary | ICD-10-CM | POA: Diagnosis not present

## 2015-08-13 DIAGNOSIS — F17210 Nicotine dependence, cigarettes, uncomplicated: Secondary | ICD-10-CM | POA: Insufficient documentation

## 2015-08-13 DIAGNOSIS — Z87440 Personal history of urinary (tract) infections: Secondary | ICD-10-CM | POA: Diagnosis not present

## 2015-08-13 DIAGNOSIS — Z8659 Personal history of other mental and behavioral disorders: Secondary | ICD-10-CM | POA: Insufficient documentation

## 2015-08-13 DIAGNOSIS — Z87442 Personal history of urinary calculi: Secondary | ICD-10-CM | POA: Insufficient documentation

## 2015-08-13 DIAGNOSIS — Z79899 Other long term (current) drug therapy: Secondary | ICD-10-CM | POA: Insufficient documentation

## 2015-08-13 DIAGNOSIS — Z8639 Personal history of other endocrine, nutritional and metabolic disease: Secondary | ICD-10-CM | POA: Insufficient documentation

## 2015-08-13 LAB — URINE MICROSCOPIC-ADD ON

## 2015-08-13 LAB — BASIC METABOLIC PANEL
Anion gap: 9 (ref 5–15)
BUN: 8 mg/dL (ref 6–20)
CHLORIDE: 101 mmol/L (ref 101–111)
CO2: 27 mmol/L (ref 22–32)
CREATININE: 0.74 mg/dL (ref 0.44–1.00)
Calcium: 9 mg/dL (ref 8.9–10.3)
GFR calc non Af Amer: >60 mL/min (ref >60)
Glucose, Bld: 90 mg/dL (ref 65–99)
POTASSIUM: 3.7 mmol/L (ref 3.5–5.1)
SODIUM: 137 mmol/L (ref 135–145)

## 2015-08-13 LAB — CBC WITH DIFFERENTIAL/PLATELET
BASOS PCT: 0 %
Basophils Absolute: 0 10*3/uL (ref 0.0–0.1)
EOS ABS: 0.2 10*3/uL (ref 0.0–0.7)
Eosinophils Relative: 2 %
HEMATOCRIT: 39 % (ref 36.0–46.0)
HEMOGLOBIN: 13.4 g/dL (ref 12.0–15.0)
LYMPHS ABS: 2.7 10*3/uL (ref 0.7–4.0)
Lymphocytes Relative: 29 %
MCH: 32.4 pg (ref 26.0–34.0)
MCHC: 34.4 g/dL (ref 30.0–36.0)
MCV: 94.2 fL (ref 78.0–100.0)
Monocytes Absolute: 0.6 10*3/uL (ref 0.1–1.0)
Monocytes Relative: 6 %
NEUTROS PCT: 63 %
Neutro Abs: 5.7 10*3/uL (ref 1.7–7.7)
Platelets: 256 10*3/uL (ref 150–400)
RBC: 4.14 MIL/uL (ref 3.87–5.11)
RDW: 12.8 % (ref 11.5–15.5)
WBC: 9.2 10*3/uL (ref 4.0–10.5)

## 2015-08-13 LAB — URINALYSIS, ROUTINE W REFLEX MICROSCOPIC
BILIRUBIN URINE: NEGATIVE
Glucose, UA: NEGATIVE mg/dL
Hgb urine dipstick: NEGATIVE
Ketones, ur: NEGATIVE mg/dL
NITRITE: NEGATIVE
PH: 5.5 (ref 5.0–8.0)
Protein, ur: NEGATIVE mg/dL
Specific Gravity, Urine: 1.015 (ref 1.005–1.030)

## 2015-08-13 LAB — POC URINE PREG, ED: PREG TEST UR: NEGATIVE

## 2015-08-13 MED ORDER — METHOCARBAMOL 500 MG PO TABS: 500.0000 mg | ORAL_TABLET | Freq: Three times a day (TID) | ORAL | Status: DC | PRN

## 2015-08-13 NOTE — ED Provider Notes (Signed)
CSN: [id number]     Arrival date & time 08/13/15  2052 History  By signing my name below, I, [name], attest that this documentation has been prepared under the direction and in the presence of Domenic Moras, PA-C.  Electronically Signed: [name], ED Scribe. 08/13/2015. 9:31 PM.  Chief Complaint  Patient presents with  . Back Pain   The history is provided by the patient. No language interpreter was used.   HPI Comments: Kaitlyn Kramer is a 29 y.o. female, with a PMhx of arthritis, UTI, and nephrolithiasis, who presents to the Emergency Department complaining of sharp, intermittent, right lower back pain intermittent radiating to right lower abdomen onset 3.5 hours ago while sitting on the couch; unrelieved by ibuprofen.  She reports the pain is worse with breathing/movement and denies alleviating factors.  She also notes associated urinary frequency and malodorous urine. She denies hx of IVDU, CA, back surgery.  She denies dysuria, hematuria, fever, chills, n/v, injury, activity change, appetite change.     Past Medical History  Diagnosis Date  . Arthritis   . UTI (lower urinary tract infection)   . Nephrolithiasis   . Hypokalemia   . Anxiety    Past Surgical History  Procedure Laterality Date  . Dilation and curettage of uterus    . Tubal ligation    . Incision and drainage abscess anal     No family history on file. Social History  Substance Use Topics  . Smoking status: Current Every Day Smoker -- 0.00 packs/day for 12 years    Types: Cigarettes  . Smokeless tobacco: Never Used  . Alcohol Use: No   OB History    No data available     Review of Systems  Constitutional: Negative for fever, chills, activity change and appetite change.  Gastrointestinal: Positive for abdominal pain.  Genitourinary: Positive for frequency. Negative for dysuria and hematuria.  Musculoskeletal: Positive for back pain.   Allergies  Naproxen  Home Medications   Prior to Admission  medications   Medication Sig Start Date End Date Taking? Authorizing Provider  cephALEXin (KEFLEX) 500 MG capsule Take 1 capsule (500 mg total) by mouth 3 (three) times daily. X 11 days 05/24/14   Ripudeep Krystal Eaton, MD  HYDROcodone-acetaminophen (NORCO/VICODIN) 5-325 MG per tablet Take 1 tablet by mouth every 6 (six) hours as needed. 05/26/14   Gregor Hams, MD  ibuprofen (ADVIL,MOTRIN) 600 MG tablet Take 1 tablet (600 mg total) by mouth every 6 (six) hours as needed for fever or mild pain. 05/24/14   Ripudeep Krystal Eaton, MD  metroNIDAZOLE (FLAGYL) 500 MG tablet Take 1 tablet (500 mg total) by mouth 2 (two) times daily. 05/26/14   Gregor Hams, MD  miconazole nitrate (MONISTAT 3 COMBINATION PACK) 200-2 MG-% (9GM) KIT Place 1 applicator vaginally daily. For 3 days    Historical Provider, MD  promethazine (PHENERGAN) 12.5 MG tablet Take 1 tablet (12.5 mg total) by mouth every 6 (six) hours as needed for nausea or vomiting. 05/24/14   Ripudeep K Rai, MD   BP 130/74 mmHg  Pulse 87  Temp(Src) 98.4 F (36.9 C) (Oral)  Resp 16  Ht 5' 7"  (1.702 m)  Wt 201 lb (91.173 kg)  BMI 31.47 kg/m2  SpO2 98%  LMP 07/30/2015 (Approximate) Physical Exam  Constitutional: She is oriented to person, place, and time. She appears well-developed and well-nourished. No distress.  HENT:  Head: Normocephalic and atraumatic.  Eyes: Conjunctivae and EOM are normal.  Neck:  Neck supple. No tracheal deviation present.  Cardiovascular: Normal rate.   Pulmonary/Chest: Effort normal. No respiratory distress.  Abdominal: Soft. There is no tenderness.  Musculoskeletal: Normal range of motion.  Neurological: She is alert and oriented to person, place, and time.  Right paralumbar spinal muscle TTP.  No CVA tenderness.  No significant midline spine tenderness. Patellar tendon reflexes intact.  Normal ambulation.   Skin: Skin is warm and dry.  Psychiatric: She has a normal mood and affect. Her behavior is normal.  Nursing note and vitals  reviewed.   ED Course  Procedures  DIAGNOSTIC STUDIES: Oxygen Saturation is 98% on RA, normal by my interpretation.    COORDINATION OF CARE: 9:35 PM Awaiting results of labs. Pt acknowledges and agrees to plan.   Right paralumbar spinal muscle TTP.  No CVA tenderness.  No significant midline spine tenderness.  Labs Review Labs Reviewed  URINALYSIS, ROUTINE W REFLEX MICROSCOPIC (NOT AT [facility]) - Abnormal; Notable for the following:    APPearance CLOUDY (*)    Leukocytes, UA TRACE (*)    All other components within normal limits  URINE MICROSCOPIC-ADD ON - Abnormal; Notable for the following:    Squamous Epithelial / LPF 6-30 (*)    Bacteria, UA FEW (*)    All other components within normal limits  CBC WITH DIFFERENTIAL/PLATELET  BASIC METABOLIC PANEL  POC URINE PREG, ED    Imaging Review No results found. I have personally reviewed and evaluated these images and lab results as part of my medical decision-making.   EKG Interpretation None      MDM   Final diagnoses:  Right-sided low back pain without sciatica    BP 130/74 mmHg  Pulse 87  Temp(Src) 98.4 F (36.9 C) (Oral)  Resp 16  Ht 5' 7"  (1.702 m)  Wt 91.173 kg  BMI 31.47 kg/m2  SpO2 98%  LMP 07/30/2015 (Approximate)   I personally performed the services described in this documentation, which was scribed in my presence. The recorded information has been reviewed and is accurate.     Pt here with R lower back pain.  Pain reproducible on exam and appears MSK.  Her pregnancy test is negative, normal CBC and BMP.  UA without evidence of UTI, no Hgb in urine to suggest Kidney stones.  No midline spine tenderness.  Pt denies vaginal discharge and does not have any significant abd pain on exam to suggest GU pathology.  Therefore, recommend ibuprofen/tylenol and will prescribe muscle relaxant.  Recommend pt to return if developing vaginal discharge, worsening sxs or if pt has any other concerns.    Domenic Moras,  PA-C 08/13/15 2154  Domenic Moras, PA-C 08/13/15 8416  Davonna Belling, MD 08/13/15 [id number]

## 2015-08-13 NOTE — ED Notes (Signed)
PA at bedside.

## 2015-08-13 NOTE — Discharge Instructions (Signed)
Take ibuprofen or tylenol as needed for pain.  Take muscle relaxant at night to help with pain.  Return to ER if you develop fever, vagina bleeding, vaginal discharge, or worsening pain despite treatment.    Back Pain, Adult Back pain is very common in adults.The cause of back pain is rarely dangerous and the pain often gets better over time.The cause of your back pain may not be known. Some common causes of back pain include:  Strain of the muscles or ligaments supporting the spine.  Wear and tear (degeneration) of the spinal disks.  Arthritis.  Direct injury to the back. For many people, back pain may return. Since back pain is rarely dangerous, most people can learn to manage this condition on their own. HOME CARE INSTRUCTIONS Watch your back pain for any changes. The following actions may help to lessen any discomfort you are feeling:  Remain active. It is stressful on your back to sit or stand in one place for long periods of time. Do not sit, drive, or stand in one place for more than 30 minutes at a time. Take short walks on even surfaces as soon as you are able.Try to increase the length of time you walk each day.  Exercise regularly as directed by your health care provider. Exercise helps your back heal faster. It also helps avoid future injury by keeping your muscles strong and flexible.  Do not stay in bed.Resting more than 1-2 days can delay your recovery.  Pay attention to your body when you bend and lift. The most comfortable positions are those that put less stress on your recovering back. Always use proper lifting techniques, including:  Bending your knees.  Keeping the load close to your body.  Avoiding twisting.  Find a comfortable position to sleep. Use a firm mattress and lie on your side with your knees slightly bent. If you lie on your back, put a pillow under your knees.  Avoid feeling anxious or stressed.Stress increases muscle tension and can worsen back  pain.It is important to recognize when you are anxious or stressed and learn ways to manage it, such as with exercise.  Take medicines only as directed by your health care provider. Over-the-counter medicines to reduce pain and inflammation are often the most helpful.Your health care provider may prescribe muscle relaxant drugs.These medicines help dull your pain so you can more quickly return to your normal activities and healthy exercise.  Apply ice to the injured area:  Put ice in a plastic bag.  Place a towel between your skin and the bag.  Leave the ice on for 20 minutes, 2-3 times a day for the first 2-3 days. After that, ice and heat may be alternated to reduce pain and spasms.  Maintain a healthy weight. Excess weight puts extra stress on your back and makes it difficult to maintain good posture. SEEK MEDICAL CARE IF:  You have pain that is not relieved with rest or medicine.  You have increasing pain going down into the legs or buttocks.  You have pain that does not improve in one week.  You have night pain.  You lose weight.  You have a fever or chills. SEEK IMMEDIATE MEDICAL CARE IF:   You develop new bowel or bladder control problems.  You have unusual weakness or numbness in your arms or legs.  You develop nausea or vomiting.  You develop abdominal pain.  You feel faint.   This information is not intended to replace advice  given to you by your health care provider. Make sure you discuss any questions you have with your health care provider.   Document Released: 08/14/2005 Document Revised: 09/04/2014 Document Reviewed: 12/16/2013 Elsevier Interactive Patient Education [employer].

## 2015-08-13 NOTE — ED Notes (Signed)
Pt. reports intermittent low back pain onset 6 pm today , denies dysuria , no hematuria , denies fall or injury.

## 2015-08-13 NOTE — ED Notes (Signed)
Pt able to ambulate independently 

## 2015-08-30 ENCOUNTER — Encounter (HOSPITAL_COMMUNITY): Payer: Self-pay | Admitting: *Deleted

## 2015-08-30 ENCOUNTER — Emergency Department (HOSPITAL_COMMUNITY)
Admission: EM | Admit: 2015-08-30 | Discharge: 2015-08-30 | Disposition: A | Discharge status: Home / Self Care | Payer: Medicaid Other | Attending: Emergency Medicine | Admitting: Emergency Medicine

## 2015-08-30 DIAGNOSIS — M1990 Unspecified osteoarthritis, unspecified site: Secondary | ICD-10-CM | POA: Diagnosis not present

## 2015-08-30 DIAGNOSIS — Z792 Long term (current) use of antibiotics: Secondary | ICD-10-CM | POA: Diagnosis not present

## 2015-08-30 DIAGNOSIS — K0889 Other specified disorders of teeth and supporting structures: Secondary | ICD-10-CM | POA: Diagnosis present

## 2015-08-30 DIAGNOSIS — F17210 Nicotine dependence, cigarettes, uncomplicated: Secondary | ICD-10-CM | POA: Diagnosis not present

## 2015-08-30 DIAGNOSIS — Z87440 Personal history of urinary (tract) infections: Secondary | ICD-10-CM | POA: Insufficient documentation

## 2015-08-30 DIAGNOSIS — K029 Dental caries, unspecified: Secondary | ICD-10-CM | POA: Insufficient documentation

## 2015-08-30 MED ORDER — HYDROCODONE-ACETAMINOPHEN 5-325 MG PO TABS
1.0000 | ORAL_TABLET | Freq: Once | ORAL | Status: AC
  Administered 2015-08-30: 1 via ORAL
  Filled 2015-08-30: qty 1

## 2015-08-30 MED ORDER — CLINDAMYCIN HCL 150 MG PO CAPS: 450.0000 mg | ORAL_CAPSULE | Freq: Three times a day (TID) | ORAL | Status: DC

## 2015-08-30 MED ORDER — HYDROCODONE-ACETAMINOPHEN 5-325 MG PO TABS: 2.0000 | ORAL_TABLET | Freq: Four times a day (QID) | ORAL | Status: DC | PRN

## 2015-08-30 NOTE — ED Notes (Signed)
Pt reports teeth removal on 08-04-15. Pt finished the antibx. And is now having sinus pain . Pt also has pain at dental site.

## 2015-08-30 NOTE — ED Notes (Signed)
SEE PA assessment 

## 2015-08-30 NOTE — ED Provider Notes (Signed)
CSN: [id number]     Arrival date & time 08/30/15  1836 History  By signing my name below, I, Kaitlyn Kramer, attest that this documentation has been prepared under the direction and in the presence of Kaitlyn Simmer, PA-C. Electronically Signed: Starleen Kramer ED Scribe. 08/30/2015. 6:58 PM.    No chief complaint on file.  The history is provided by the patient. No language interpreter was used.   HPI Comments: Kaitlyn Kramer is a 30 y.o. female who presents to the Emergency Department complaining of 7/10, throbbing right upper/lower dental pain that worsened today.  The patient reports she had wisdom tooth extractions the area of complaint on 12/1 and 12/7 with only moderate pain that has recently worsened.  She was prescribed Amoxicillin and penicillin which she finished "some time last week."  She reports purulent drainage from her right upper dentition yesterday.  She called her oral surgeon today and was told she could not be seen until 08/31/14 with her current symptoms.  She also notes some mild frontal sinus pressure.  Patient states she stopped smoking approximately two weeks ago.  She denies fever.   Past Medical History  Diagnosis Date  . Arthritis   . UTI (lower urinary tract infection)   . Nephrolithiasis   . Hypokalemia   . Anxiety    Past Surgical History  Procedure Laterality Date  . Dilation and curettage of uterus    . Tubal ligation    . Incision and drainage abscess anal     No family history on file. Social History  Substance Use Topics  . Smoking status: Current Every Day Smoker -- 0.00 packs/day for 12 years    Types: Cigarettes  . Smokeless tobacco: Never Used  . Alcohol Use: No   OB History    No data available     Review of Systems  10 Systems reviewed and all are negative for acute change except as noted in the HPI.   Allergies  Naproxen  Home Medications   Prior to Admission medications   Medication Sig Start Date End Date Taking? Authorizing Provider   cephALEXin (KEFLEX) 500 MG capsule Take 1 capsule (500 mg total) by mouth 3 (three) times daily. X 11 days 05/24/14   Ripudeep Krystal Eaton, MD  HYDROcodone-acetaminophen (NORCO/VICODIN) 5-325 MG per tablet Take 1 tablet by mouth every 6 (six) hours as needed. 05/26/14   Gregor Hams, MD  ibuprofen (ADVIL,MOTRIN) 600 MG tablet Take 1 tablet (600 mg total) by mouth every 6 (six) hours as needed for fever or mild pain. 05/24/14   Ripudeep Krystal Eaton, MD  methocarbamol (ROBAXIN) 500 MG tablet Take 1 tablet (500 mg total) by mouth every 8 (eight) hours as needed for muscle spasms (or pain). 08/13/15   Domenic Moras, PA-C  metroNIDAZOLE (FLAGYL) 500 MG tablet Take 1 tablet (500 mg total) by mouth 2 (two) times daily. 05/26/14   Gregor Hams, MD  miconazole nitrate (MONISTAT 3 COMBINATION PACK) 200-2 MG-% (9GM) KIT Place 1 applicator vaginally daily. For 3 days    Historical Provider, MD  promethazine (PHENERGAN) 12.5 MG tablet Take 1 tablet (12.5 mg total) by mouth every 6 (six) hours as needed for nausea or vomiting. 05/24/14   Ripudeep K Rai, MD   BP 123/70 mmHg  Pulse 94  Temp(Src) 98.1 F (36.7 C) (Oral)  Resp 18  SpO2 100%  LMP 07/30/2015 (Approximate) Physical Exam  Constitutional: She is oriented to person, place, and time. She appears well-developed and  well-nourished. No distress.  HENT:  Head: Normocephalic and atraumatic.  Right Ear: External ear normal.  Left Ear: External ear normal.  Nose: Nose normal.  Mouth/Throat: Oropharynx is clear and moist. No oropharyngeal exudate.  Poor dentition.  Multiple dental caries.  No erythema or drainage currently.  Tender on gumline BL  Eyes: Conjunctivae and EOM are normal.  Neck: Neck supple. No tracheal deviation present.  Cardiovascular: Normal rate.   Pulmonary/Chest: Effort normal. No respiratory distress.  Musculoskeletal: Normal range of motion.  Neurological: She is alert and oriented to person, place, and time.  Skin: Skin is warm and dry.   Psychiatric: She has a normal mood and affect. Her behavior is normal.  Nursing note and vitals reviewed.   ED Course  Procedures   DIAGNOSTIC STUDIES: Oxygen Saturation is 100% on RA, normal by my interpretation.    COORDINATION OF CARE:  6:58 PM Discussed treatment plan with patient at bedside.  Patient acknowledges and agrees with plan.    MDM   Final diagnoses:  Pain, dental   Patient non-toxic appearing and VSS. Given patient has completed a course of antibiotics last week and had new, purulent drainage yesterday, will treat as abx failure and give pain meds until patient can be seen by dentist. Patient may be safely discharged home. Discussed reasons for return. Patient to follow-up with dentist as soon as possible. Patient in understanding and agreement with the plan.  I personally performed the services described in this documentation, which was scribed in my presence. The recorded information has been reviewed and is accurate.   Parlier Lions, PA-C 09/01/15 1158  Noemi Chapel, MD 09/04/15 [phone]

## 2015-08-30 NOTE — Discharge Instructions (Signed)
Ms. Bosie ClosDonna S Danser,  Nice meeting you! Please follow-up with your dentist. Return to the emergency department if you develop fevers, chills, or do not improve on antibiotics. Feel better soon!  S. Lane HackerNicole Jp Eastham, PA-C

## 2015-10-12 ENCOUNTER — Emergency Department (HOSPITAL_COMMUNITY)
Admission: EM | Admit: 2015-10-12 | Discharge: 2015-10-12 | Disposition: A | Discharge status: Home / Self Care | Payer: Medicaid Other | Attending: Emergency Medicine | Admitting: Emergency Medicine

## 2015-10-12 ENCOUNTER — Emergency Department (HOSPITAL_COMMUNITY): Payer: Medicaid Other

## 2015-10-12 ENCOUNTER — Encounter (HOSPITAL_COMMUNITY): Payer: Self-pay | Admitting: Emergency Medicine

## 2015-10-12 DIAGNOSIS — F419 Anxiety disorder, unspecified: Secondary | ICD-10-CM | POA: Diagnosis not present

## 2015-10-12 DIAGNOSIS — Z79899 Other long term (current) drug therapy: Secondary | ICD-10-CM | POA: Insufficient documentation

## 2015-10-12 DIAGNOSIS — F17210 Nicotine dependence, cigarettes, uncomplicated: Secondary | ICD-10-CM | POA: Insufficient documentation

## 2015-10-12 DIAGNOSIS — R1030 Lower abdominal pain, unspecified: Secondary | ICD-10-CM | POA: Insufficient documentation

## 2015-10-12 DIAGNOSIS — Z87440 Personal history of urinary (tract) infections: Secondary | ICD-10-CM | POA: Diagnosis not present

## 2015-10-12 DIAGNOSIS — E669 Obesity, unspecified: Secondary | ICD-10-CM | POA: Diagnosis not present

## 2015-10-12 DIAGNOSIS — Z3202 Encounter for pregnancy test, result negative: Secondary | ICD-10-CM | POA: Insufficient documentation

## 2015-10-12 DIAGNOSIS — M1990 Unspecified osteoarthritis, unspecified site: Secondary | ICD-10-CM | POA: Diagnosis not present

## 2015-10-12 DIAGNOSIS — R1031 Right lower quadrant pain: Secondary | ICD-10-CM

## 2015-10-12 DIAGNOSIS — Z87442 Personal history of urinary calculi: Secondary | ICD-10-CM | POA: Diagnosis not present

## 2015-10-12 LAB — COMPREHENSIVE METABOLIC PANEL
ALK PHOS: 64 U/L (ref 38–126)
ALT: 15 U/L (ref 14–54)
ANION GAP: 10 (ref 5–15)
AST: 16 U/L (ref 15–41)
Albumin: 3.3 g/dL — ABNORMAL LOW (ref 3.5–5.0)
BILIRUBIN TOTAL: 0.1 mg/dL — AB (ref 0.3–1.2)
BUN: 14 mg/dL (ref 6–20)
CALCIUM: 8.6 mg/dL — AB (ref 8.9–10.3)
CO2: 22 mmol/L (ref 22–32)
CREATININE: 0.97 mg/dL (ref 0.44–1.00)
Chloride: 107 mmol/L (ref 101–111)
Glucose, Bld: 97 mg/dL (ref 65–99)
Potassium: 3.9 mmol/L (ref 3.5–5.1)
SODIUM: 139 mmol/L (ref 135–145)
TOTAL PROTEIN: 6 g/dL — AB (ref 6.5–8.1)

## 2015-10-12 LAB — URINALYSIS, ROUTINE W REFLEX MICROSCOPIC
BILIRUBIN URINE: NEGATIVE
Glucose, UA: NEGATIVE mg/dL
HGB URINE DIPSTICK: NEGATIVE
Ketones, ur: NEGATIVE mg/dL
Leukocytes, UA: NEGATIVE
NITRITE: NEGATIVE
PROTEIN: NEGATIVE mg/dL
Specific Gravity, Urine: 1.019 (ref 1.005–1.030)
pH: 6.5 (ref 5.0–8.0)

## 2015-10-12 LAB — WET PREP, GENITAL
Sperm: NONE SEEN
Trich, Wet Prep: NONE SEEN
YEAST WET PREP: NONE SEEN

## 2015-10-12 LAB — CBC
HCT: 37.2 % (ref 36.0–46.0)
HEMOGLOBIN: 12.5 g/dL (ref 12.0–15.0)
MCH: 31.3 pg (ref 26.0–34.0)
MCHC: 33.6 g/dL (ref 30.0–36.0)
MCV: 93.2 fL (ref 78.0–100.0)
PLATELETS: 205 10*3/uL (ref 150–400)
RBC: 3.99 MIL/uL (ref 3.87–5.11)
RDW: 12.4 % (ref 11.5–15.5)
WBC: 9.1 10*3/uL (ref 4.0–10.5)

## 2015-10-12 LAB — POC URINE PREG, ED: PREG TEST UR: NEGATIVE

## 2015-10-12 LAB — LIPASE, BLOOD: Lipase: 33 U/L (ref 11–51)

## 2015-10-12 MED ORDER — IBUPROFEN 600 MG PO TABS: 600.0000 mg | ORAL_TABLET | Freq: Four times a day (QID) | ORAL | Status: DC | PRN

## 2015-10-12 NOTE — Discharge Instructions (Signed)

## 2015-10-12 NOTE — ED Notes (Signed)
Pt. reports low abdominal pain radiating to lower back onset 2 weeks ago with mild nausea , no emesis , diarrhea last week , no fever or chills.

## 2015-10-12 NOTE — ED Provider Notes (Signed)
CSN: [id number]     Arrival date & time 10/12/15  0117 History   First MD Initiated Contact with Patient 10/12/15 0545     Chief Complaint  Patient presents with  . Abdominal Pain  . Back Pain     (Consider location/radiation/quality/duration/timing/severity/associated sxs/prior Treatment) HPI This is a 30 year old female who presents with abdominal pain. Patient reports a two-week history of lower abdominal cramping that radiates to her back. She also reports that she's not had a period since December. However, her tubes are tied and she does not believe she could be pregnant. She states the pain has gotten progressively worse. It is currently 10 out of 10. Denies any dysuria, hematuria, nausea, vomiting, or diarrhea. Denies fevers or chills. Has never had pain like this in the past. Past Medical History  Diagnosis Date  . Arthritis   . UTI (lower urinary tract infection)   . Nephrolithiasis   . Hypokalemia   . Anxiety    Past Surgical History  Procedure Laterality Date  . Dilation and curettage of uterus    . Tubal ligation    . Incision and drainage abscess anal     No family history on file. Social History  Substance Use Topics  . Smoking status: Current Every Day Smoker -- 0.00 packs/day for 12 years    Types: Cigarettes  . Smokeless tobacco: Never Used  . Alcohol Use: No   OB History    No data available     Review of Systems  Constitutional: Negative for fever.  Respiratory: Negative for chest tightness and shortness of breath.   Cardiovascular: Negative for chest pain.  Gastrointestinal: Positive for abdominal pain. Negative for nausea, vomiting, diarrhea and constipation.  Genitourinary: Negative for dysuria, hematuria, vaginal bleeding, vaginal discharge and vaginal pain.  Musculoskeletal: Positive for back pain.  Skin: Negative for wound.  Psychiatric/Behavioral: Negative for confusion.  All other systems reviewed and are negative.     Allergies   Naproxen  Home Medications   Prior to Admission medications   Medication Sig Start Date End Date Taking? Authorizing Provider  ALPRAZolam Prudy Feeler) 1 MG tablet Take 1 mg by mouth 2 (two) times daily as needed for anxiety.   Yes Historical Provider, MD  phentermine (ADIPEX-P) 37.5 MG tablet Take 37.5 mg by mouth daily before breakfast.   Yes Historical Provider, MD  potassium chloride (K-DUR,KLOR-CON) 10 MEQ tablet Take 10 mEq by mouth daily.   Yes Historical Provider, MD  cephALEXin (KEFLEX) 500 MG capsule Take 1 capsule (500 mg total) by mouth 3 (three) times daily. X 11 days Patient not taking: Reported on 10/12/2015 05/24/14   Ripudeep Jenna Luo, MD  clindamycin (CLEOCIN) 150 MG capsule Take 3 capsules (450 mg total) by mouth 3 (three) times daily. Patient not taking: Reported on 10/12/2015 08/30/15   Melton Krebs, PA-C  HYDROcodone-acetaminophen (NORCO/VICODIN) 5-325 MG tablet Take 2 tablets by mouth every 6 (six) hours as needed. Patient not taking: Reported on 10/12/2015 08/30/15   Melton Krebs, PA-C  ibuprofen (ADVIL,MOTRIN) 600 MG tablet Take 1 tablet (600 mg total) by mouth every 6 (six) hours as needed for fever or mild pain. 10/12/15   Shon Baton, MD  methocarbamol (ROBAXIN) 500 MG tablet Take 1 tablet (500 mg total) by mouth every 8 (eight) hours as needed for muscle spasms (or pain). Patient not taking: Reported on 10/12/2015 08/13/15   Fayrene Helper, PA-C  metroNIDAZOLE (FLAGYL) 500 MG tablet Take 1 tablet (500 mg total) by  mouth 2 (two) times daily. Patient not taking: Reported on 10/12/2015 05/26/14   Rodolph Bong, MD  promethazine (PHENERGAN) 12.5 MG tablet Take 1 tablet (12.5 mg total) by mouth every 6 (six) hours as needed for nausea or vomiting. Patient not taking: Reported on 10/12/2015 05/24/14   Ripudeep K Rai, MD   BP 100/56 mmHg  Pulse 73  Temp(Src) 98.4 F (36.9 C) (Oral)  Resp 16  Wt 210 lb (95.255 kg)  SpO2 97%  LMP 08/28/2015 Physical Exam   Constitutional: She is oriented to person, place, and time. She appears well-developed and well-nourished. No distress.  Obese  HENT:  Head: Normocephalic and atraumatic.  Cardiovascular: Normal rate, regular rhythm and normal heart sounds.   No murmur heard. Pulmonary/Chest: Effort normal and breath sounds normal. No respiratory distress. She has no wheezes.  Abdominal: Soft. Bowel sounds are normal. There is no tenderness. There is no rebound.  Genitourinary:  Normal external vaginal exam, right-sided neck no skull tenderness without mass, no cervical motion tenderness, scant vaginal discharge  Neurological: She is alert and oriented to person, place, and time.  Skin: Skin is warm and dry.  Psychiatric: She has a normal mood and affect.  Nursing note and vitals reviewed.   ED Course  Procedures (including critical care time) Labs Review Labs Reviewed  WET PREP, GENITAL - Abnormal; Notable for the following:    Clue Cells Wet Prep HPF POC PRESENT (*)    WBC, Wet Prep HPF POC MODERATE (*)    All other components within normal limits  COMPREHENSIVE METABOLIC PANEL - Abnormal; Notable for the following:    Calcium 8.6 (*)    Total Protein 6.0 (*)    Albumin 3.3 (*)    Total Bilirubin 0.1 (*)    All other components within normal limits  LIPASE, BLOOD  CBC  URINALYSIS, ROUTINE W REFLEX MICROSCOPIC (NOT AT [facility])  POC URINE PREG, ED  GC/CHLAMYDIA PROBE AMP ([place]) NOT AT [facility]    Imaging Review US Transvaginal Non-ob  10/12/2015  CLINICAL DATA:  Right lower quadrant pain for 2 weeks, initial encounter EXAM: TRANSABDOMINAL AND TRANSVAGINAL ULTRASOUND OF PELVIS TECHNIQUE: Both transabdominal and transvaginal ultrasound examinations of the pelvis were performed. Transabdominal technique was performed for global imaging of the pelvis including uterus, ovaries, adnexal regions, and pelvic cul-de-sac. It was necessary to proceed with endovaginal exam following the transabdominal  exam to visualize the ovaries. COMPARISON:  None FINDINGS: Uterus Measurements: 7.9 x 4.9 x 5.3 cm. No fibroids or other mass visualized. Endometrium Thickness: 5.9 mm.  Fluid is noted within the endometrial cavity. Right ovary Measurements: 2.6 x 1.6 x 1.6 cm. Normal appearance/no adnexal mass. Left ovary Measurements: 4.6 x 1.7 x 2.8 cm. There is a 1.8 cm regular partially cystic area identified within the left ovary likely representing a decompressed cyst. Other findings Minimal free fluid is noted. IMPRESSION: Likely involuting cyst in the left ovary. Mild free pelvic fluid. Fluid in the endometrial canal likely related to the patient's menstrual status Electronically Signed   By: Alcide Clever M.D.   On: 10/12/2015 07:47   US Pelvis Complete  10/12/2015  CLINICAL DATA:  Right lower quadrant pain for 2 weeks, initial encounter EXAM: TRANSABDOMINAL AND TRANSVAGINAL ULTRASOUND OF PELVIS TECHNIQUE: Both transabdominal and transvaginal ultrasound examinations of the pelvis were performed. Transabdominal technique was performed for global imaging of the pelvis including uterus, ovaries, adnexal regions, and pelvic cul-de-sac. It was necessary to proceed with endovaginal exam following the  transabdominal exam to visualize the ovaries. COMPARISON:  None FINDINGS: Uterus Measurements: 7.9 x 4.9 x 5.3 cm. No fibroids or other mass visualized. Endometrium Thickness: 5.9 mm.  Fluid is noted within the endometrial cavity. Right ovary Measurements: 2.6 x 1.6 x 1.6 cm. Normal appearance/no adnexal mass. Left ovary Measurements: 4.6 x 1.7 x 2.8 cm. There is a 1.8 cm regular partially cystic area identified within the left ovary likely representing a decompressed cyst. Other findings Minimal free fluid is noted. IMPRESSION: Likely involuting cyst in the left ovary. Mild free pelvic fluid. Fluid in the endometrial canal likely related to the patient's menstrual status Electronically Signed   By: Alcide Clever M.D.   On:  10/12/2015 07:47   I have personally reviewed and evaluated these images and lab results as part of my medical decision-making.   EKG Interpretation None      MDM   Final diagnoses:  Lower abdominal pain   patient presents with lower abdominal pain and back pain for 2 weeks. Increasing in intensity. Nontoxic. Vital signs are reassuring.  Exam notable for right-sided adnexal tenderness. Otherwise patient with normal exam. She was given Percocet. Ultrasound shows no evidence of right ovarian mass or cyst. She does have some mild free fluid. Patient will need OB/GYN follow-up. She was provided with this. She was given supportive measures and return precautions.  After history, exam, and medical workup I feel the patient has been appropriately medically screened and is safe for discharge home. Pertinent diagnoses were discussed with the patient. Patient was given return precautions.     Shon Baton, MD 10/12/15 [phone]

## 2015-10-13 LAB — GC/CHLAMYDIA PROBE AMP (~~LOC~~) NOT AT ARMC
Chlamydia: NEGATIVE
Neisseria Gonorrhea: NEGATIVE

## 2017-02-19 IMAGING — US US TRANSVAGINAL NON-OB
1 series · 14 of 25 positions shown · non-contrast
Comparison: None

CLINICAL DATA: Right lower quadrant pain for 2 weeks, initial
encounter



[Series 1: us transvaginal non-ob · 0.24mm/px · 14 of 141 slices shown]
[im 1/141]
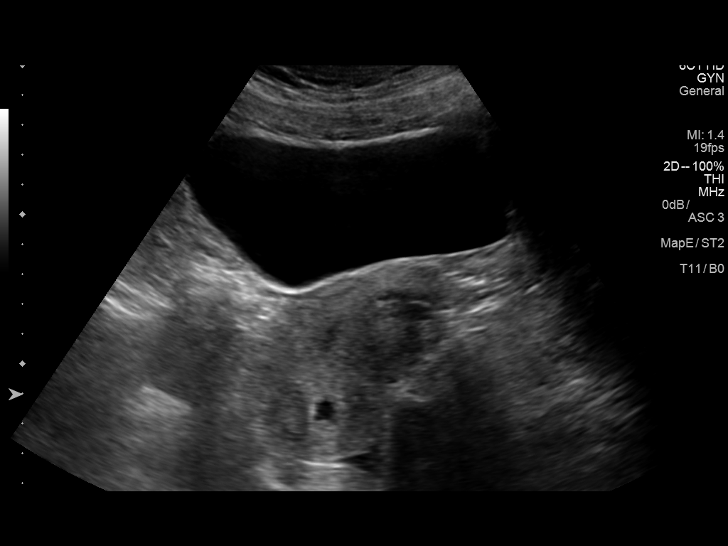
[im 12/141]
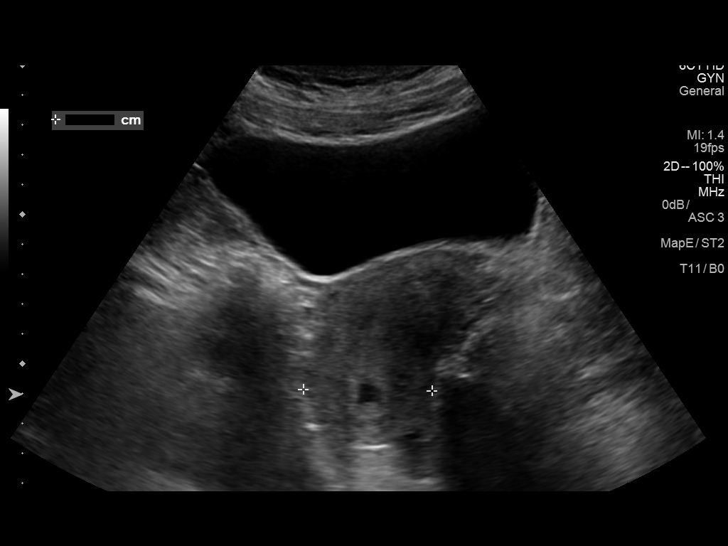
[im 24/141]
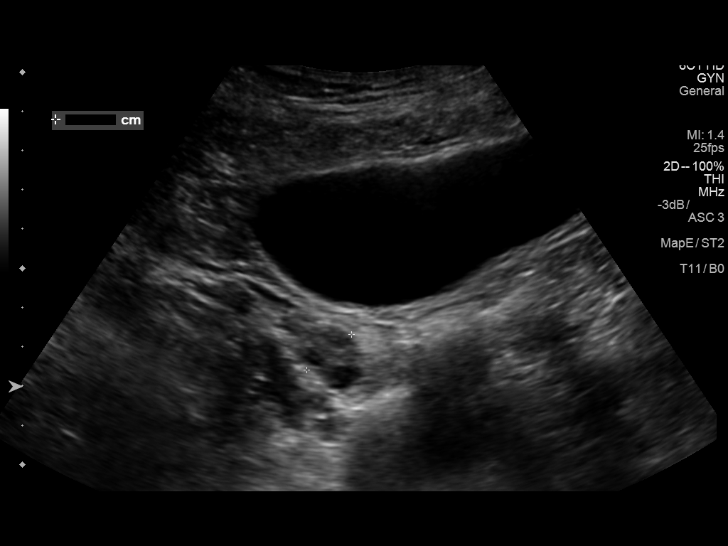
[im 36/141]
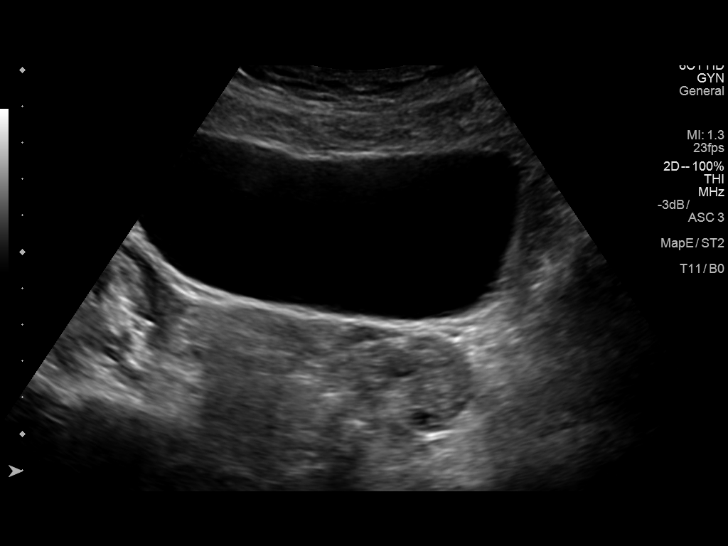
[im 47/141]
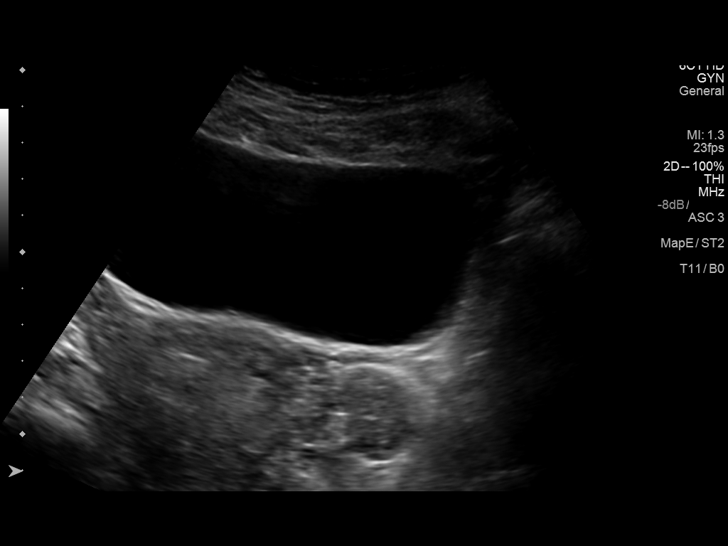
[im 53/141]
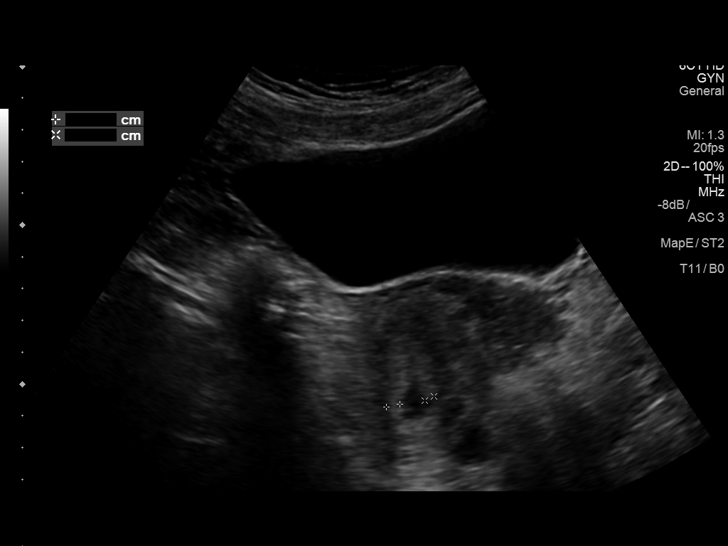
[im 65/141]
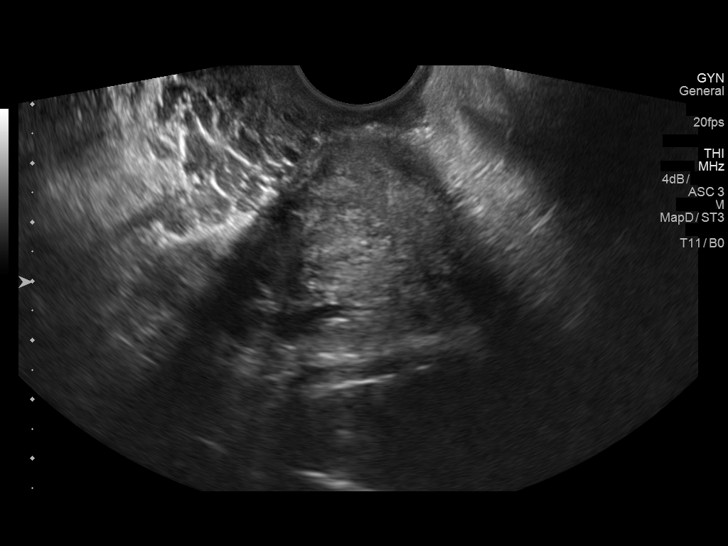
[im 76/141]
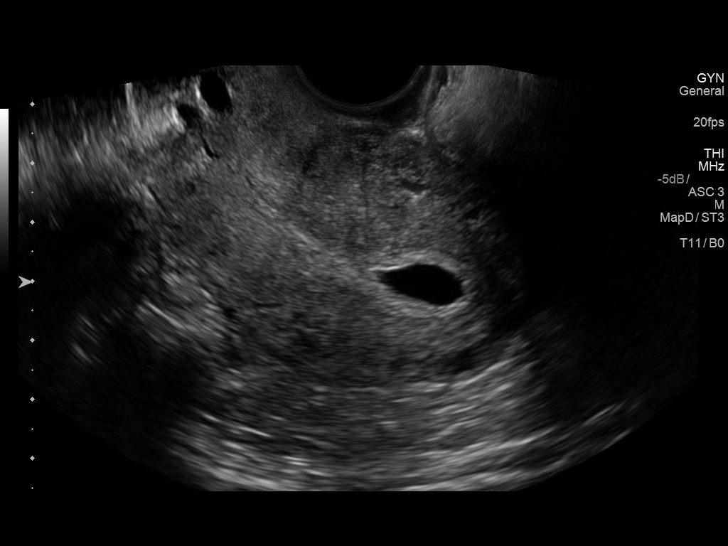
[im 88/141]
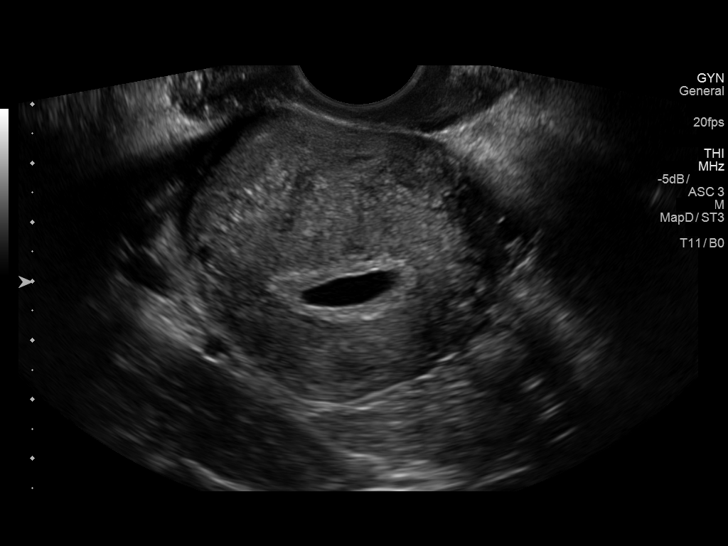
[im 94/141]
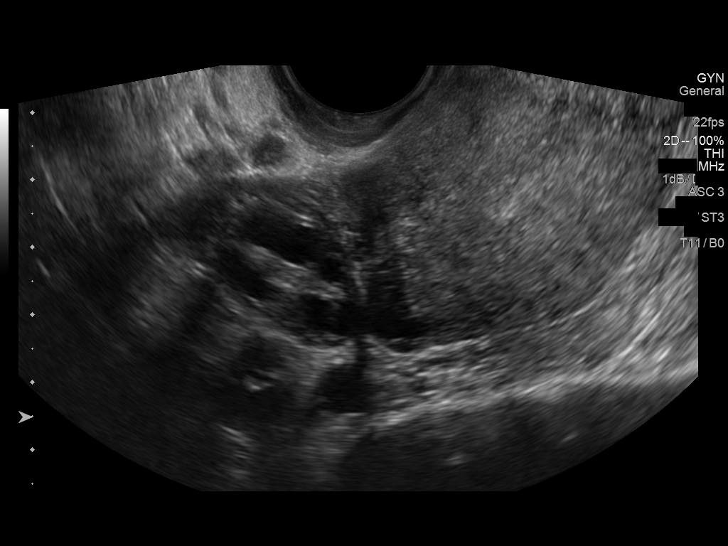
[im 106/141]
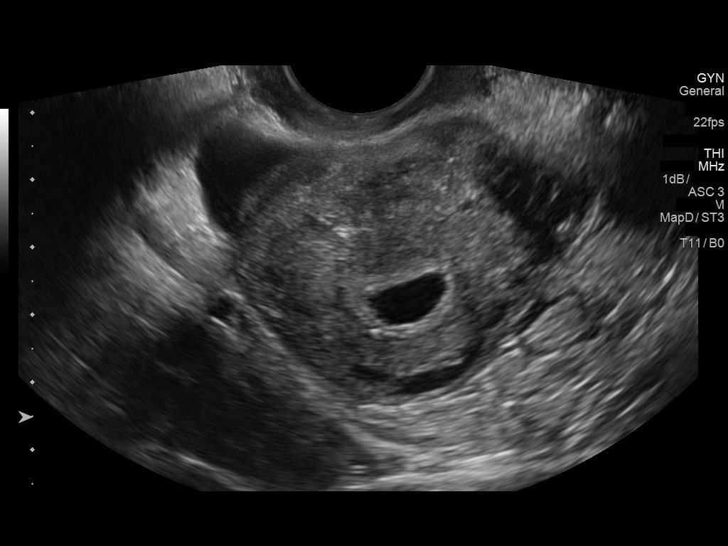
[im 117/141]
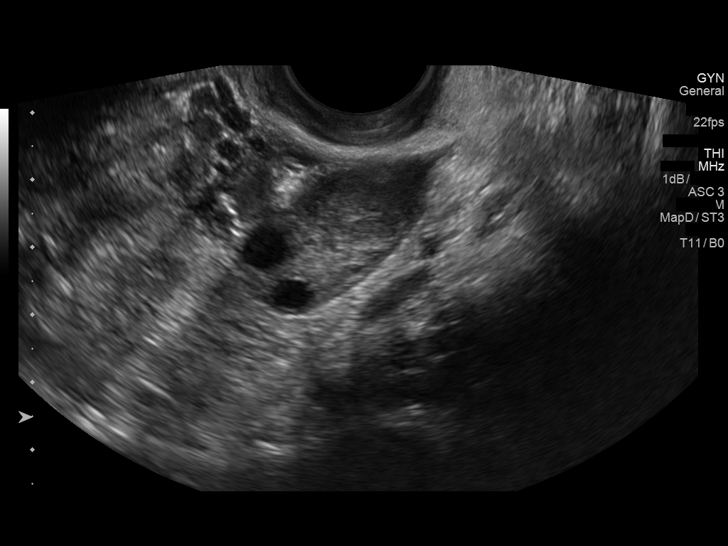
[im 129/141]
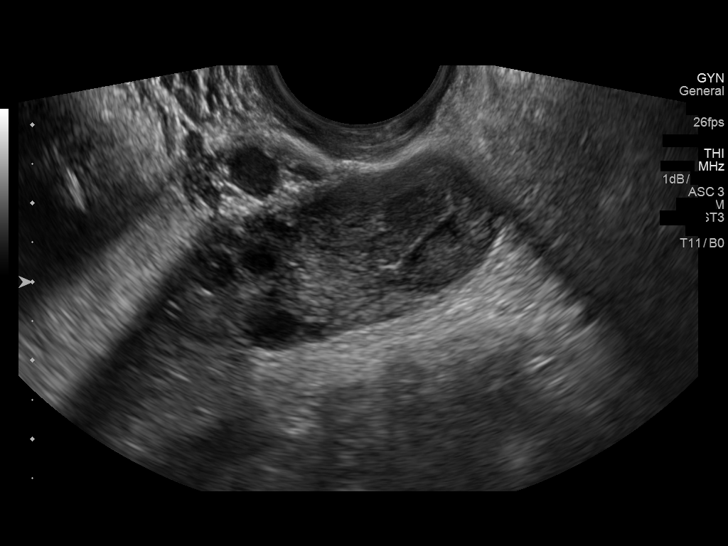
[im 141/141]
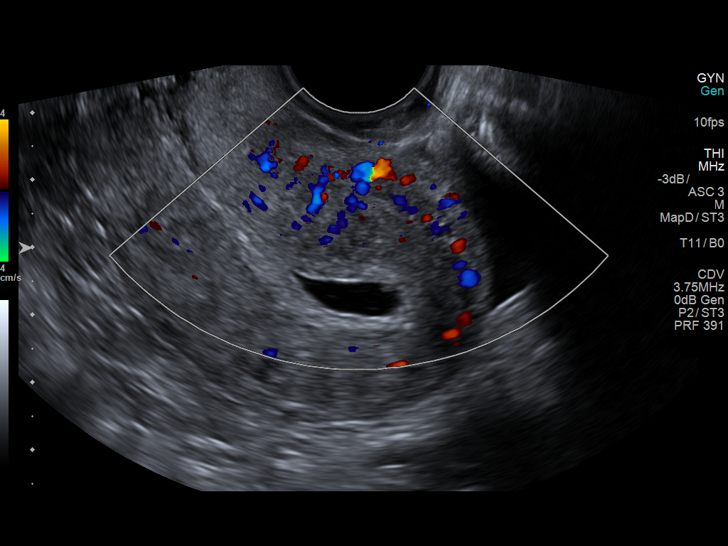

[14 of 25 positions shown; findings below may reference images not displayed]

FINDINGS: Uterus

Measurements: 7.9 x 4.9 x 5.3 cm.. No fibroids or other mass
visualized.

Endometrium

Thickness: 5.9 mm..  Fluid is noted within the endometrial cavity.

Right ovary

Measurements: 2.6 x 1.6 x 1.6 cm.. Normal appearance/no adnexal
mass.

Left ovary

Measurements: 4.6 x 1.7 x 2.8 cm.. There is a 1.8 cm regular
partially cystic area identified within the left ovary likely
representing a decompressed cyst.

Other findings

Minimal free fluid is noted.
IMPRESSION: Likely involuting cyst in the left ovary.

Mild free pelvic fluid.

Fluid in the endometrial canal likely related to the patient's
menstrual status

## 2018-04-24 ENCOUNTER — Other Ambulatory Visit: Payer: Self-pay

## 2018-04-24 ENCOUNTER — Inpatient Hospital Stay (HOSPITAL_COMMUNITY)
Admission: AD | Admit: 2018-04-24 | Discharge: 2018-04-24 | Disposition: A | Discharge status: Home / Self Care | Payer: Medicaid Other | Source: Ambulatory Visit | Attending: Obstetrics and Gynecology | Admitting: Obstetrics and Gynecology

## 2018-04-24 ENCOUNTER — Encounter (HOSPITAL_COMMUNITY): Payer: Self-pay | Admitting: *Deleted

## 2018-04-24 DIAGNOSIS — F17210 Nicotine dependence, cigarettes, uncomplicated: Secondary | ICD-10-CM | POA: Diagnosis not present

## 2018-04-24 DIAGNOSIS — M1990 Unspecified osteoarthritis, unspecified site: Secondary | ICD-10-CM | POA: Diagnosis not present

## 2018-04-24 DIAGNOSIS — Z886 Allergy status to analgesic agent status: Secondary | ICD-10-CM | POA: Insufficient documentation

## 2018-04-24 DIAGNOSIS — Z79899 Other long term (current) drug therapy: Secondary | ICD-10-CM | POA: Insufficient documentation

## 2018-04-24 DIAGNOSIS — M549 Dorsalgia, unspecified: Secondary | ICD-10-CM | POA: Diagnosis present

## 2018-04-24 DIAGNOSIS — Z9889 Other specified postprocedural states: Secondary | ICD-10-CM | POA: Insufficient documentation

## 2018-04-24 DIAGNOSIS — N390 Urinary tract infection, site not specified: Secondary | ICD-10-CM

## 2018-04-24 DIAGNOSIS — Z8349 Family history of other endocrine, nutritional and metabolic diseases: Secondary | ICD-10-CM | POA: Insufficient documentation

## 2018-04-24 DIAGNOSIS — Z880 Allergy status to penicillin: Secondary | ICD-10-CM | POA: Diagnosis not present

## 2018-04-24 DIAGNOSIS — Z833 Family history of diabetes mellitus: Secondary | ICD-10-CM | POA: Diagnosis not present

## 2018-04-24 HISTORY — DX: Unspecified abnormal cytological findings in specimens from vagina: R87.629

## 2018-04-24 LAB — URINALYSIS, ROUTINE W REFLEX MICROSCOPIC
Bilirubin Urine: NEGATIVE
Glucose, UA: NEGATIVE mg/dL
KETONES UR: NEGATIVE mg/dL
Nitrite: NEGATIVE
PH: 6 (ref 5.0–8.0)
Protein, ur: NEGATIVE mg/dL
SPECIFIC GRAVITY, URINE: 1.009 (ref 1.005–1.030)
WBC, UA: >50 WBC/hpf — ABNORMAL HIGH (ref 0–5)

## 2018-04-24 LAB — POCT PREGNANCY, URINE: Preg Test, Ur: NEGATIVE

## 2018-04-24 MED ORDER — SULFAMETHOXAZOLE-TRIMETHOPRIM 800-160 MG PO TABS: 1.0000 | ORAL_TABLET | Freq: Two times a day (BID) | ORAL | 0 refills | Status: AC

## 2018-04-24 MED ORDER — PHENAZOPYRIDINE HCL 200 MG PO TABS: 200.0000 mg | ORAL_TABLET | Freq: Three times a day (TID) | ORAL | 0 refills | Status: DC

## 2018-04-24 NOTE — Discharge Instructions (Signed)
In late 2019, the [facility] will be moving to the [facility]. At that time, the MAU (Maternity Admissions Unit), where you are being seen today, will no longer take care of non-pregnant patients. We strongly encourage you to find a doctor's office before that time, so that you can be seen with any GYN concerns, like vaginal discharge, urinary tract infection, etc.. in a timely manner.  In order to make an office visit more convenient, the Center for [facility] at [facility] will be offering evening hours with same-day appointments, walk-in appointments and scheduled appointments available during this time.  Center for [facility] @ [facility] Hours: Monday - 8am - 7:30 pm with walk-in between 4pm- 7:30 pm Tuesday - 8 am - 5 pm (starting 11/27/17 we will be open late and accepting walk-ins from 4pm - 7:30pm) Wednesday - 8 am - 5 pm (starting 02/27/18 we will be open late and accepting walk-ins from 4pm - 7:30pm) Thursday 8 am - 5 pm (starting 05/30/18 we will be open late and accepting walk-ins from 4pm - 7:30pm) Friday 8 am - 5 pm  For an appointment please call the Center for [facility] @ [facility] at [phone]  For urgent needs, [facility] is also available for management of urgent GYN complaints such as vaginal discharge or urinary tract infections.     Urinary Tract Infection, Adult A urinary tract infection (UTI) is an infection of any part of the urinary tract. The urinary tract includes the:  Kidneys.  Ureters.  Bladder.  Urethra.  These organs make, store, and get rid of pee (urine) in the body. Follow these instructions at home:  Take over-the-counter and prescription medicines only as told by your doctor.  If you were prescribed an antibiotic medicine, take it as told by your doctor. Do not stop taking the antibiotic even if you start to feel better.  Avoid the following  drinks: ? Alcohol. ? Caffeine. ? Tea. ? Carbonated drinks.  Drink enough fluid to keep your pee clear or pale yellow.  Keep all follow-up visits as told by your doctor. This is important.  Make sure to: ? Empty your bladder often and completely. Do not to hold pee for long periods of time. ? Empty your bladder before and after sex. ? Wipe from front to back after a bowel movement if you are female. Use each tissue one time when you wipe. Contact a doctor if:  You have back pain.  You have a fever.  You feel sick to your stomach (nauseous).  You throw up (vomit).  Your symptoms do not get better after 3 days.  Your symptoms go away and then come back. Get help right away if:  You have very bad back pain.  You have very bad lower belly (abdominal) pain.  You are throwing up and cannot keep down any medicines or water. This information is not intended to replace advice given to you by your health care provider. Make sure you discuss any questions you have with your health care provider. Document Released: 01/31/2008 Document Revised: 01/20/2016 Document Reviewed: 07/05/2015 Elsevier Interactive Patient Education  [employer]2018 [employer]

## 2018-04-24 NOTE — MAU Note (Signed)
Drinks a lot of soda.  Last Thurs, she started getting a pain when she urinated like her bladder wasn't emptying.  Fri she got some of the OTC meds for bladder/ uti pain.  Took it for 2 days.  Feeling worse, tired, back hurts, aching, nausea. Frequency and urgency with urination, pain has gotten worse. Denies fever.

## 2018-04-24 NOTE — MAU Provider Note (Signed)
History     CSN: [id number]  Arrival date and time: 04/24/18 1402   First Provider Initiated Contact with Patient 04/24/18 1534      Chief Complaint  Patient presents with  . Back Pain  . Dysuria   HPI   Ms.Bosie ClosDonna S Gough is a 32 y.o. female [phone] here in MAU with dysuria, urgency, and frequency. Symptoms started last Thursday. Symptoms are constant. The symptoms have worsened since onset. She tried azo over the counter without relief.   OB History    Gravida  6   Para  4   Term  4   Preterm  0   AB  2   Living  4     SAB  1   TAB  1   Ectopic      Multiple      Live Births  4           Past Medical History:  Diagnosis Date  . Anxiety   . Arthritis   . Hypokalemia   . Nephrolithiasis   . UTI (lower urinary tract infection)   . Vaginal Pap smear, abnormal    HPV,  has not had f/u or repeat pap    Past Surgical History:  Procedure Laterality Date  . DILATION AND CURETTAGE OF UTERUS    . INCISION AND DRAINAGE ABSCESS ANAL    . TUBAL LIGATION      Family History  Problem Relation Age of Onset  . Diabetes Mother   . Hyperlipidemia Mother     Social History   Tobacco Use  . Smoking status: Current Every Day Smoker    Packs/day: 1.00    Years: 17.00    Pack years: 17.00    Types: Cigarettes  . Smokeless tobacco: Never Used  Substance Use Topics  . Alcohol use: No  . Drug use: No    Allergies:  Allergies  Allergen Reactions  . Naproxen Hives    Tolerates Ibuprofen  . Penicillins     Has patient had a PCN reaction causing immediate rash, facial/tongue/throat swelling, SOB or lightheadedness with hypotension: no  Has patient had a PCN reaction causing severe rash involving mucus membranes or skin necrosis: No Has patient had a PCN reaction that required hospitalization: No Has patient had a PCN reaction occurring within the last 10 years: No If all of the above answers are "NO", then may proceed with Cephalosporin use.     Medications Prior to Admission  Medication Sig Dispense Refill Last Dose  . potassium chloride (K-DUR,KLOR-CON) 10 MEQ tablet Take 10 mEq by mouth daily.   04/23/2018 at Unknown time    Results for orders placed or performed during the hospital encounter of 04/24/18 (from the past 48 hour(s))  Urinalysis, Routine w reflex microscopic     Status: Abnormal   Collection Time: 04/24/18  2:38 PM  Result Value Ref Range   Color, Urine YELLOW YELLOW   APPearance HAZY (A) CLEAR   Specific Gravity, Urine 1.009 1.005 - 1.030   pH 6.0 5.0 - 8.0   Glucose, UA NEGATIVE NEGATIVE mg/dL   Hgb urine dipstick SMALL (A) NEGATIVE   Bilirubin Urine NEGATIVE NEGATIVE   Ketones, ur NEGATIVE NEGATIVE mg/dL   Protein, ur NEGATIVE NEGATIVE mg/dL   Nitrite NEGATIVE NEGATIVE   Leukocytes, UA MODERATE (A) NEGATIVE   RBC / HPF 0-5 0 - 5 RBC/hpf   WBC, UA >50 (H) 0 - 5 WBC/hpf   Bacteria, UA FEW (A) NONE SEEN  Squamous Epithelial / LPF 11-20 0 - 5    Comment: Performed at [facility], [address] [place], [place] [address]  Pregnancy, urine POC     Status: None   Collection Time: 04/24/18  2:44 PM  Result Value Ref Range   Preg Test, Ur NEGATIVE NEGATIVE    Comment:        THE SENSITIVITY OF THIS METHODOLOGY IS >24 mIU/mL     Review of Systems  Constitutional: Negative for fever.  Gastrointestinal: Negative for abdominal pain.  Genitourinary: Positive for dysuria, frequency and urgency. Negative for flank pain.  Musculoskeletal: Positive for back pain.   Physical Exam   Blood pressure 128/72, pulse 78, temperature 98 F (36.7 C), temperature source Oral, resp. rate 16, weight 92.4 kg, last menstrual period 04/11/2018, SpO2 96 %.  Physical Exam  Constitutional: She is oriented to person, place, and time. She appears well-developed and well-nourished. No distress.  HENT:  Head: Normocephalic.  Eyes: Pupils are equal, round, and reactive to light.  GI: Soft. She exhibits no  distension. There is no tenderness. There is no rebound and no CVA tenderness.  Musculoskeletal: Normal range of motion.  Neurological: She is alert and oriented to person, place, and time.  Skin: Skin is warm. She is not diaphoretic.  Psychiatric: Her behavior is normal.   MAU Course  Procedures  None  MDM  UA  Assessment and Plan   A:  1. Acute UTI      P:  Discharge home in stable condition Rx: Bactrim, pyridium Return to MAU for emergencies only Establish care with GYN  Freddi Schrager, Harolyn Rutherford, NP 04/25/2018 11:53 AM

## 2020-04-15 ENCOUNTER — Emergency Department (HOSPITAL_COMMUNITY)
Admission: EM | Admit: 2020-04-15 | Discharge: 2020-04-15 | Disposition: A | Discharge status: Home / Self Care | Payer: Medicaid Other | Attending: Emergency Medicine | Admitting: Emergency Medicine

## 2020-04-15 ENCOUNTER — Encounter (HOSPITAL_COMMUNITY): Payer: Self-pay | Admitting: Emergency Medicine

## 2020-04-15 ENCOUNTER — Other Ambulatory Visit: Payer: Self-pay

## 2020-04-15 DIAGNOSIS — K0889 Other specified disorders of teeth and supporting structures: Secondary | ICD-10-CM | POA: Diagnosis present

## 2020-04-15 MED ORDER — CLINDAMYCIN HCL 150 MG PO CAPS: 300.0000 mg | ORAL_CAPSULE | Freq: Four times a day (QID) | ORAL | 0 refills | Status: DC

## 2020-04-15 MED ORDER — HYDROCODONE-ACETAMINOPHEN 5-325 MG PO TABS: 1.0000 | ORAL_TABLET | Freq: Once | ORAL | Status: DC

## 2020-04-15 MED ORDER — HYDROCODONE-ACETAMINOPHEN 5-325 MG PO TABS: 1.0000 | ORAL_TABLET | ORAL | 0 refills | Status: DC | PRN

## 2020-04-15 MED ORDER — CLINDAMYCIN HCL 150 MG PO CAPS: 300.0000 mg | ORAL_CAPSULE | Freq: Once | ORAL | Status: DC

## 2020-04-15 NOTE — ED Triage Notes (Signed)
Pt states she has a broken tooth on upper right side in the back. C/o "extreme pain." Pt states she has a dental appt in January. Last tylenol dose at 2am but cannot use ibuprofen d/t stomach ulcers.

## 2020-04-15 NOTE — ED Provider Notes (Signed)
[facility] EMERGENCY DEPARTMENT Provider Note   CSN: [id number] Arrival date & time: 04/15/20  4196     History Chief Complaint  Patient presents with  . Dental Pain    Kaitlyn Kramer is a 34 y.o. female.  Patient presents to the emergency department for evaluation of toothache.  Patient reports that she has a fractured tooth in her right upper side of her mouth.  Tooth has been broken for some time but tonight she started having severe pain.  There is some numbness on the upper portion of her cheek, no significant swelling and no fever.        Past Medical History:  Diagnosis Date  . Anxiety   . Arthritis   . Hypokalemia   . Nephrolithiasis   . UTI (lower urinary tract infection)   . 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 Right ureteral stone 05/23/2014  . Pyelonephritis 05/21/2014  . Sepsis (HCC) 05/21/2014  . Nausea and vomiting 05/21/2014  . SIRS (systemic inflammatory response syndrome) (HCC) 05/21/2014    Past Surgical History:  Procedure Laterality Date  . DILATION AND CURETTAGE OF UTERUS    . INCISION AND DRAINAGE ABSCESS ANAL    . TUBAL LIGATION       OB History    Gravida  6   Para  4   Term  4   Preterm  0   AB  2   Living  4     SAB  1   TAB  1   Ectopic      Multiple      Live Births  4           Family History  Problem Relation Age of Onset  . Diabetes Mother   . Hyperlipidemia Mother     Social History   Tobacco Use  . Smoking status: Current Every Day Smoker    Packs/day: 1.00    Years: 17.00    Pack years: 17.00    Types: Cigarettes  . Smokeless tobacco: Never Used  Substance Use Topics  . Alcohol use: No  . Drug use: No    Home Medications Prior to Admission medications   Medication Sig Start Date End Date Taking? Authorizing Provider  clindamycin (CLEOCIN) 150 MG capsule Take 2 capsules (300 mg total) by mouth 4  (four) times daily. 04/15/20   Gilda Crease, MD  HYDROcodone-acetaminophen (NORCO/VICODIN) 5-325 MG tablet Take 1-2 tablets by mouth every 4 (four) hours as needed. 04/15/20   Gilda Crease, MD  phenazopyridine (PYRIDIUM) 200 MG tablet Take 1 tablet (200 mg total) by mouth 3 (three) times daily. 04/24/18   Rasch, Victorino Dike I, NP  potassium chloride (K-DUR,KLOR-CON) 10 MEQ tablet Take 10 mEq by mouth daily.    [provider]    Allergies    Naproxen and Penicillins  Review of Systems   Review of Systems  Constitutional: Negative for fever.  HENT: Positive for dental problem.     Physical Exam Updated Vital Signs BP 128/85   Pulse 85   Temp 98.2 F (36.8 C) (Oral)   Resp 20   Ht 5\' 7"  (1.702 m)   Wt 92.4 kg   SpO2 99%   BMI 31.90 kg/m   Physical Exam Constitutional:      Appearance: Normal appearance.  HENT:     Nose:  Nose normal.     Mouth/Throat:     Dentition: Dental tenderness, gingival swelling and dental caries present. No dental abscesses.  Eyes:     Pupils: Pupils are equal, round, and reactive to light.  Cardiovascular:     Rate and Rhythm: Normal rate and regular rhythm.  Pulmonary:     Effort: Pulmonary effort is normal.     Breath sounds: Normal breath sounds.  Musculoskeletal:        General: Normal range of motion.  Skin:    General: Skin is warm and dry.  Neurological:     General: No focal deficit present.     Mental Status: She is alert and oriented to person, place, and time.     ED Results / Procedures / Treatments   Labs (all labs ordered are listed, but only abnormal results are displayed) Labs Reviewed - No data to display  EKG None  Radiology No results found.  Procedures Procedures (including critical care time)  Medications Ordered in ED Medications - No data to display  ED Course  I have reviewed the triage vital signs and the nursing notes.  Pertinent labs & imaging results that were available  during my care of the patient were reviewed by me and considered in my medical decision making (see chart for details).    MDM Rules/Calculators/A&P                          Patient has multiple dental caries and fractured teeth with new onset dental pain.  No drainable abscess noted on examination.  She is afebrile, appears well.  She reports that she was unable to find a dentist that could see her before January.  Will treat with antibiotics, analgesia.  Patient given dental resources.  Final Clinical Impression(s) / ED Diagnoses Final diagnoses:  Pain, dental    Rx / DC Orders ED Discharge Orders         Ordered    HYDROcodone-acetaminophen (NORCO/VICODIN) 5-325 MG tablet  Every 4 hours PRN        04/15/20 0611    clindamycin (CLEOCIN) 150 MG capsule  4 times daily        04/15/20 0611           Gilda Crease, MD 04/15/20 [phone]

## 2020-04-15 NOTE — ED Notes (Signed)
Pt was upset she had to wait. Notified her that the doctor was in an emergency and would be right in. She said "I've been in pain for 2 hours." Pt then walked out of ED.

## 2020-05-13 ENCOUNTER — Other Ambulatory Visit: Payer: Medicaid Other | Admitting: Advanced Practice Midwife

## 2020-07-25 ENCOUNTER — Encounter (HOSPITAL_COMMUNITY): Payer: Self-pay

## 2020-07-25 ENCOUNTER — Other Ambulatory Visit: Payer: Self-pay

## 2020-07-25 ENCOUNTER — Emergency Department (HOSPITAL_COMMUNITY)
Admission: EM | Admit: 2020-07-25 | Discharge: 2020-07-25 | Disposition: A | Discharge status: Home / Self Care | Payer: Medicaid Other | Attending: Emergency Medicine | Admitting: Emergency Medicine

## 2020-07-25 DIAGNOSIS — H60391 Other infective otitis externa, right ear: Secondary | ICD-10-CM | POA: Insufficient documentation

## 2020-07-25 DIAGNOSIS — H6691 Otitis media, unspecified, right ear: Secondary | ICD-10-CM

## 2020-07-25 DIAGNOSIS — H9201 Otalgia, right ear: Secondary | ICD-10-CM | POA: Diagnosis present

## 2020-07-25 DIAGNOSIS — F17210 Nicotine dependence, cigarettes, uncomplicated: Secondary | ICD-10-CM | POA: Diagnosis not present

## 2020-07-25 MED ORDER — AZITHROMYCIN 250 MG PO TABS: 250.0000 mg | ORAL_TABLET | Freq: Every day | ORAL | 0 refills | Status: DC

## 2020-07-25 MED ORDER — AZITHROMYCIN 250 MG PO TABS
500.0000 mg | ORAL_TABLET | Freq: Once | ORAL | Status: AC
  Administered 2020-07-25: 500 mg via ORAL
  Filled 2020-07-25: qty 2

## 2020-07-25 NOTE — ED Triage Notes (Signed)
Pt states that she has had a ear ache in her right ear x4 days. Pt says she had a sinus infection/cold 2 weeks ago. Pt says that it feels like a pulling sensation in ear.

## 2020-07-25 NOTE — ED Provider Notes (Signed)
[facility] EMERGENCY DEPARTMENT Provider Note   CSN: [id number] Arrival date & time: 07/25/20  4081     History Chief Complaint  Patient presents with  . Kaitlyn Kramer is a 34 y.o. female.  Patient presents to the emergency department with complaints of right ear pain.  Patient reports that she has had cold symptoms for the last several days.  She has been taking over-the-counter decongestant and her nasal congestion is improved but now the right ear hurts.  No drainage or hearing loss.        Past Medical History:  Diagnosis Date  . Anxiety   . Arthritis   . Hypokalemia   . Nephrolithiasis   . UTI (lower urinary tract infection)   . 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 Right ureteral stone 05/23/2014  . Pyelonephritis 05/21/2014  . Sepsis (HCC) 05/21/2014  . Nausea and vomiting 05/21/2014  . SIRS (systemic inflammatory response syndrome) (HCC) 05/21/2014    Past Surgical History:  Procedure Laterality Date  . DILATION AND CURETTAGE OF UTERUS    . INCISION AND DRAINAGE ABSCESS ANAL    . TUBAL LIGATION       OB History    Gravida  6   Para  4   Term  4   Preterm  0   AB  2   Living  4     SAB  1   TAB  1   Ectopic      Multiple      Live Births  4           Family History  Problem Relation Age of Onset  . Diabetes Mother   . Hyperlipidemia Mother     Social History   Tobacco Use  . Smoking status: Current Every Day Smoker    Packs/day: 1.00    Years: 17.00    Pack years: 17.00    Types: Cigarettes  . Smokeless tobacco: Never Used  Substance Use Topics  . Alcohol use: No  . Drug use: No    Home Medications Prior to Admission medications   Medication Sig Start Date End Date Taking? Authorizing Provider  azithromycin (ZITHROMAX) 250 MG tablet Take 1 tablet (250 mg total) by mouth daily. 07/26/20   Gilda Crease, MD   clindamycin (CLEOCIN) 150 MG capsule Take 2 capsules (300 mg total) by mouth 4 (four) times daily. 04/15/20   Gilda Crease, MD  phenazopyridine (PYRIDIUM) 200 MG tablet Take 1 tablet (200 mg total) by mouth 3 (three) times daily. 04/24/18   Rasch, Victorino Dike I, NP  potassium chloride (K-DUR,KLOR-CON) 10 MEQ tablet Take 10 mEq by mouth daily.    [provider]    Allergies    Naproxen and Penicillins  Review of Systems   Review of Systems  HENT: Positive for congestion and ear pain.   All other systems reviewed and are negative.   Physical Exam Updated Vital Signs Ht 5\' 7"  (1.702 m)   Wt 99.8 kg   BMI 34.46 kg/m   Physical Exam Vitals and nursing note reviewed.  Constitutional:      General: She is not in acute distress.    Appearance: Normal appearance. She is well-developed.  HENT:     Head: Normocephalic and atraumatic.     Right Ear: Hearing normal. Tympanic membrane  is erythematous.     Left Ear: Hearing normal.     Nose: Nose normal.  Eyes:     Conjunctiva/sclera: Conjunctivae normal.     Pupils: Pupils are equal, round, and reactive to light.  Cardiovascular:     Rate and Rhythm: Regular rhythm.     Heart sounds: S1 normal and S2 normal. No murmur heard.  No friction rub. No gallop.   Pulmonary:     Effort: Pulmonary effort is normal. No respiratory distress.     Breath sounds: Normal breath sounds.  Chest:     Chest wall: No tenderness.  Abdominal:     General: Bowel sounds are normal.     Palpations: Abdomen is soft.     Tenderness: There is no abdominal tenderness. There is no guarding or rebound. Negative signs include Murphy's sign and McBurney's sign.     Hernia: No hernia is present.  Musculoskeletal:        General: Normal range of motion.     Cervical back: Normal range of motion and neck supple.  Skin:    General: Skin is warm and dry.     Findings: No rash.  Neurological:     Mental Status: She is alert and oriented to person,  place, and time.     GCS: GCS eye subscore is 4. GCS verbal subscore is 5. GCS motor subscore is 6.     Cranial Nerves: No cranial nerve deficit.     Sensory: No sensory deficit.     Coordination: Coordination normal.  Psychiatric:        Speech: Speech normal.        Behavior: Behavior normal.        Thought Content: Thought content normal.     ED Results / Procedures / Treatments   Labs (all labs ordered are listed, but only abnormal results are displayed) Labs Reviewed - No data to display  EKG None  Radiology No results found.  Procedures Procedures (including critical care time)  Medications Ordered in ED Medications  azithromycin (ZITHROMAX) tablet 500 mg (has no administration in time range)    ED Course  I have reviewed the triage vital signs and the nursing notes.  Pertinent labs & imaging results that were available during my care of the patient were reviewed by me and considered in my medical decision making (see chart for details).    MDM Rules/Calculators/A&P                           Final Clinical Impression(s) / ED Diagnoses Final diagnoses:  Acute infection of right ear    Rx / DC Orders ED Discharge Orders         Ordered    azithromycin (ZITHROMAX) 250 MG tablet  Daily        07/25/20 0634           Gilda Crease, MD 07/25/20 [phone]

## 2020-09-02 ENCOUNTER — Other Ambulatory Visit: Payer: Self-pay

## 2020-09-02 DIAGNOSIS — Z20822 Contact with and (suspected) exposure to covid-19: Secondary | ICD-10-CM

## 2020-09-06 LAB — NOVEL CORONAVIRUS, NAA: SARS-CoV-2, NAA: DETECTED — AB

## 2020-10-15 ENCOUNTER — Other Ambulatory Visit: Payer: Self-pay

## 2020-10-15 ENCOUNTER — Ambulatory Visit
Admission: EM | Admit: 2020-10-15 | Discharge: 2020-10-15 | Disposition: A | Discharge status: Home / Self Care | Payer: Medicaid Other | Attending: Family Medicine | Admitting: Family Medicine

## 2020-10-15 ENCOUNTER — Ambulatory Visit: Admit: 2020-10-15 | Discharge status: Absent without leave | Payer: Medicaid Other

## 2020-10-15 ENCOUNTER — Encounter: Payer: Self-pay | Admitting: Emergency Medicine

## 2020-10-15 DIAGNOSIS — J069 Acute upper respiratory infection, unspecified: Secondary | ICD-10-CM

## 2020-10-15 DIAGNOSIS — H66001 Acute suppurative otitis media without spontaneous rupture of ear drum, right ear: Secondary | ICD-10-CM

## 2020-10-15 MED ORDER — FLUCONAZOLE 150 MG PO TABS: ORAL_TABLET | ORAL | 0 refills | Status: DC

## 2020-10-15 MED ORDER — CEFDINIR 300 MG PO CAPS: 300.0000 mg | ORAL_CAPSULE | Freq: Two times a day (BID) | ORAL | 0 refills | Status: AC

## 2020-10-15 NOTE — Discharge Instructions (Signed)
I have sent in Cefdinir for you to take twice a day for 7 days  Follow up with this office or with primary care if symptoms are persisting.  Follow up in the ER for high fever, trouble swallowing, trouble breathing, other concerning symptoms.  

## 2020-10-15 NOTE — ED Triage Notes (Signed)
Cough, congestion and RT ear pain.  Had covid in the middle of jan this year.

## 2020-10-17 NOTE — ED Provider Notes (Signed)
[facility]   [id number] 10/15/20 Arrival Time: 1341  CC: EAR PAIN  SUBJECTIVE: History from: patient.  Kaitlyn Kramer is a 35 y.o. female who presents with of cough, congestion, and R otalgia. Reports that she had Covid about a month ago. Patient states the pain is constant and achy in character. Patient has taken OTC cough and cold with little relief. Symptoms are made worse with lying down. Denies similar symptoms in the past. Denies fever, chills, fatigue, sinus pain, rhinorrhea, ear discharge, sore throat, SOB, wheezing, chest pain, nausea, changes in bowel or bladder habits.    ROS: As per HPI.  All other pertinent ROS negative.     Past Medical History:  Diagnosis Date  . Anxiety   . Arthritis   . Hypokalemia   . Nephrolithiasis   . UTI (lower urinary tract infection)   . 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 DILATION AND CURETTAGE OF UTERUS    . INCISION AND DRAINAGE ABSCESS ANAL    . TUBAL LIGATION     Allergies  Allergen Reactions  . Naproxen Hives    Tolerates Ibuprofen  . Penicillins     Has patient had a PCN reaction causing immediate rash, facial/tongue/throat swelling, SOB or lightheadedness with hypotension: no  Has patient had a PCN reaction causing severe rash involving mucus membranes or skin necrosis: No Has patient had a PCN reaction that required hospitalization: No Has patient had a PCN reaction occurring within the last 10 years: No If all of the above answers are "NO", then may proceed with Cephalosporin use.   No current facility-administered medications on file prior to encounter.   No current outpatient medications on file prior to encounter.   Social History   Socioeconomic History  . Marital status: Married    Spouse name: Not on file  . Number of children: Not on file  . Years of education: Not on file  . Highest education level: Not on file   Occupational History  . Not on file  Tobacco Use  . Smoking status: Former Smoker    Packs/day: 1.00    Years: 17.00    Pack years: 17.00    Types: Cigarettes  . Smokeless tobacco: Never Used  Vaping Use  . Vaping Use: Every day  Substance and Sexual Activity  . Alcohol use: No  . Drug use: Yes    Types: Marijuana    Comment: daily   . Sexual activity: Not on file  Other Topics Concern  . Not on file  Social History Narrative  . Not on file   Social Determinants of Health   Financial Resource Strain: Not on file  Food Insecurity: Not on file  Transportation Needs: Not on file  Physical Activity: Not on file  Stress: Not on file  Social Connections: Not on file  Intimate Partner Violence: Not on file   Family History  Problem Relation Age of Onset  . Diabetes Mother   . Hyperlipidemia Mother     OBJECTIVE:  Vitals:   10/15/20 1436  BP: 124/81  Pulse: 86  Resp: 18  Temp: 98.4 F (36.9 C)  TempSrc: Oral  SpO2: 97%     General appearance: alert; appears fatigued HEENT: Ears: EACs clear, bilateral TMs erythematous, bulging, with effusion; Eyes: PERRL, EOMI grossly; Sinuses nontender to palpation; Nose: clear rhinorrhea; Throat: oropharynx mildly erythematous, tonsils  1+ without white tonsillar exudates, uvula midline Neck: supple without LAD Lungs: unlabored respirations, symmetrical air entry; cough: mild; no respiratory distress Heart: regular rate and rhythm.  Radial pulses 2+ symmetrical bilaterally Skin: warm and dry Psychological: alert and cooperative; normal mood and affect  Imaging: No results found.   ASSESSMENT & PLAN:  1. Non-recurrent acute suppurative otitis media of right ear without spontaneous rupture of tympanic membrane   2. URI with cough and congestion     Meds ordered this encounter  Medications  . cefdinir (OMNICEF) 300 MG capsule    Sig: Take 1 capsule (300 mg total) by mouth 2 (two) times daily for 7 days.    Dispense:  14  capsule    Refill:  0    Order Specific Question:   Supervising Provider    Answer:   Merrilee Jansky [id number]  . fluconazole (DIFLUCAN) 150 MG tablet    Sig: Take one tablet at the onset of symptoms, if still having symptoms in 3 days, take the second tablet.    Dispense:  2 tablet    Refill:  0    Order Specific Question:   Supervising Provider    Answer:   Merrilee Jansky [[id number]]    Rest and drink plenty of fluids Prescribed Cefdinir 300mg  BID x 7 days Prescribed fluconazole in case of yeast Take medications as directed and to completion Continue to use OTC ibuprofen and/ or tylenol as needed for pain control Follow up with PCP if symptoms persists Return here or go to the ER if you have any new or worsening symptoms   Reviewed expectations re: course of current medical issues. Questions answered. Outlined signs and symptoms indicating need for more acute intervention. Patient verbalized understanding. After Visit Summary given.         , NP 10/17/20 1416

## 2020-11-03 ENCOUNTER — Encounter: Payer: Self-pay | Admitting: Emergency Medicine

## 2020-11-03 ENCOUNTER — Ambulatory Visit
Admission: EM | Admit: 2020-11-03 | Discharge: 2020-11-03 | Disposition: A | Discharge status: Home / Self Care | Payer: Medicaid Other | Attending: Emergency Medicine | Admitting: Emergency Medicine

## 2020-11-03 ENCOUNTER — Other Ambulatory Visit: Payer: Self-pay

## 2020-11-03 DIAGNOSIS — Z113 Encounter for screening for infections with a predominantly sexual mode of transmission: Secondary | ICD-10-CM | POA: Insufficient documentation

## 2020-11-03 DIAGNOSIS — N898 Other specified noninflammatory disorders of vagina: Secondary | ICD-10-CM | POA: Diagnosis present

## 2020-11-03 DIAGNOSIS — N939 Abnormal uterine and vaginal bleeding, unspecified: Secondary | ICD-10-CM | POA: Insufficient documentation

## 2020-11-03 LAB — POCT URINALYSIS DIP (MANUAL ENTRY)
Bilirubin, UA: NEGATIVE
Glucose, UA: NEGATIVE mg/dL
Ketones, POC UA: NEGATIVE mg/dL
Nitrite, UA: NEGATIVE
Protein Ur, POC: 100 mg/dL — AB
Spec Grav, UA: >=1.03 — AB (ref 1.010–1.025)
Urobilinogen, UA: 0.2 E.U./dL
pH, UA: 6 (ref 5.0–8.0)

## 2020-11-03 LAB — HIV ANTIBODY (ROUTINE TESTING W REFLEX): HIV Screen 4th Generation wRfx: NONREACTIVE

## 2020-11-03 LAB — POCT URINE PREGNANCY: Preg Test, Ur: NEGATIVE

## 2020-11-03 MED ORDER — METRONIDAZOLE 500 MG PO TABS: 500.0000 mg | ORAL_TABLET | Freq: Two times a day (BID) | ORAL | 0 refills | Status: DC

## 2020-11-03 NOTE — ED Provider Notes (Signed)
[facility]   [id number] 11/03/20 Arrival Time: 0910   HG:DJMEQAS DISCHARGE  SUBJECTIVE:  Kaitlyn Kramer is a 35 y.o. female who presents with complaints of thick white vaginal discharge and vaginal bleeding x few days.  Last unprotected sex a few weeks ago.  Concerned for STDs.  LMP 2 weeks ago, post tubal ligation.  Denies alleviating or aggravating factors.  She reports similar symptoms in the past with BV.  Also reports increased urinary frequency.  She denies fever, chills, nausea, vomiting, abdominal or pelvic pain, vaginal itching, vaginal odor, dyspareunia, vaginal rashes or lesions.   Patient's last menstrual period was 10/14/2020.  ROS: As per HPI.  All other pertinent ROS negative.     Past Medical History:  Diagnosis Date  . Anxiety   . Arthritis   . Hypokalemia   . Nephrolithiasis   . UTI (lower urinary tract infection)   . 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 DILATION AND CURETTAGE OF UTERUS    . INCISION AND DRAINAGE ABSCESS ANAL    . TUBAL LIGATION     Allergies  Allergen Reactions  . Naproxen Hives    Tolerates Ibuprofen  . Penicillins     Has patient had a PCN reaction causing immediate rash, facial/tongue/throat swelling, SOB or lightheadedness with hypotension: no  Has patient had a PCN reaction causing severe rash involving mucus membranes or skin necrosis: No Has patient had a PCN reaction that required hospitalization: No Has patient had a PCN reaction occurring within the last 10 years: No If all of the above answers are "NO", then may proceed with Cephalosporin use.   No current facility-administered medications on file prior to encounter.   No current outpatient medications on file prior to encounter.    Social History   Socioeconomic History  . Marital status: Married    Spouse name: Not on file  . Number of children: Not on file  . Years of  education: Not on file  . Highest education level: Not on file  Occupational History  . Not on file  Tobacco Use  . Smoking status: Former Smoker    Packs/day: 1.00    Years: 17.00    Pack years: 17.00    Types: Cigarettes  . Smokeless tobacco: Never Used  Vaping Use  . Vaping Use: Every day  Substance and Sexual Activity  . Alcohol use: No  . Drug use: Yes    Types: Marijuana    Comment: daily   . Sexual activity: Not on file  Other Topics Concern  . Not on file  Social History Narrative  . Not on file   Social Determinants of Health   Financial Resource Strain: Not on file  Food Insecurity: Not on file  Transportation Needs: Not on file  Physical Activity: Not on file  Stress: Not on file  Social Connections: Not on file  Intimate Partner Violence: Not on file   Family History  Problem Relation Age of Onset  . Diabetes Mother   . Hyperlipidemia Mother     OBJECTIVE:  Vitals:   11/03/20 0924  BP: 114/76  Pulse: 75  Resp: 16  Temp: 97.8 F (36.6 C)  TempSrc: Oral  SpO2: 96%     General appearance: Alert, NAD, appears stated age Head: NCAT Throat: lips, mucosa, and tongue normal; teeth and gums normal Lungs: CTA bilaterally without  adventitious breath sounds Heart: regular rate and rhythm.   Abdomen: soft, non-tender; bowel sounds normal; no guarding GU: deferred Skin: warm and dry Psychological:  Alert and cooperative. Normal mood and affect.  LABS:  Results for orders placed or performed during the hospital encounter of 11/03/20  POCT urinalysis dipstick (new)  Result Value Ref Range   Color, UA orange (A) yellow   Clarity, UA cloudy (A) clear   Glucose, UA negative negative mg/dL   Bilirubin, UA negative negative   Ketones, POC UA negative negative mg/dL   Spec Grav, UA >=5.732 (A) 1.010 - 1.025   Blood, UA large (A) negative   pH, UA 6.0 5.0 - 8.0   Protein Ur, POC =100 (A) negative mg/dL   Urobilinogen, UA 0.2 0.2 or 1.0 E.U./dL    Nitrite, UA Negative Negative   Leukocytes, UA Large (3+) (A) Negative  POCT urine pregnancy  Result Value Ref Range   Preg Test, Ur Negative Negative    Labs Reviewed  POCT URINALYSIS DIP (MANUAL ENTRY) - Abnormal; Notable for the following components:      Result Value   Color, UA orange (*)    Clarity, UA cloudy (*)    Spec Grav, UA >=1.030 (*)    Blood, UA large (*)    Protein Ur, POC =100 (*)    Leukocytes, UA Large (3+) (*)    All other components within normal limits  URINE CULTURE  RPR  HIV ANTIBODY (ROUTINE TESTING W REFLEX)  POCT URINE PREGNANCY  CERVICOVAGINAL ANCILLARY ONLY    ASSESSMENT & PLAN:  1. Screening examination for venereal disease   2. Vaginal discharge   3. Vaginal bleeding     Meds ordered this encounter  Medications  . metroNIDAZOLE (FLAGYL) 500 MG tablet    Sig: Take 1 tablet (500 mg total) by mouth 2 (two) times daily.    Dispense:  14 tablet    Refill:  0    Order Specific Question:   Supervising Provider    Answer:   Eustace Moore [[id number]]    Pending: Labs Reviewed  POCT URINALYSIS DIP (MANUAL ENTRY) - Abnormal; Notable for the following components:      Result Value   Color, UA orange (*)    Clarity, UA cloudy (*)    Spec Grav, UA >=1.030 (*)    Blood, UA large (*)    Protein Ur, POC =100 (*)    Leukocytes, UA Large (3+) (*)    All other components within normal limits  URINE CULTURE  RPR  HIV ANTIBODY (ROUTINE TESTING W REFLEX)  POCT URINE PREGNANCY  CERVICOVAGINAL ANCILLARY ONLY   Urine pregnancy negative Urine culture sent.  We will follow up with you regarding abnormal results.  Vaginal self-swab obtained.  We will follow up with you regarding abnormal results HIV/ syphilis testing today Will cover for BV today given symptoms and history Prescribed metronidazole 500 mg twice daily for 7 days (do not take while consuming alcohol and/or if breastfeeding) If tests results are positive, please abstain from sexual  activity until you and your partner(s) have been treated Follow up with PCP as needed Return here or go to ER if you have any new or worsening symptoms fever, chills, nausea, vomiting, abdominal or pelvic pain, painful intercourse, vaginal discharge, vaginal bleeding, persistent symptoms despite treatment, etc...  Reviewed expectations re: course of current medical issues. Questions answered. Outlined signs and symptoms indicating need for more acute intervention. Patient verbalized understanding. After Visit  Summary given.       Rennis Harding, PA-C 11/03/20 [phone]

## 2020-11-03 NOTE — ED Triage Notes (Signed)
Thick white vaginal d/c x 1week. Would like std testing.

## 2020-11-03 NOTE — Discharge Instructions (Signed)
Urine pregnancy negative Urine culture sent.  We will follow up with you regarding abnormal results.  Vaginal self-swab obtained.  We will follow up with you regarding abnormal results HIV/ syphilis testing today Will cover for BV today given symptoms and history Prescribed metronidazole 500 mg twice daily for 7 days (do not take while consuming alcohol and/or if breastfeeding) If tests results are positive, please abstain from sexual activity until you and your partner(s) have been treated Follow up with PCP as needed Return here or go to ER if you have any new or worsening symptoms fever, chills, nausea, vomiting, abdominal or pelvic pain, painful intercourse, vaginal discharge, vaginal bleeding, persistent symptoms despite treatment, etc..Marland Kitchen

## 2020-11-04 LAB — CERVICOVAGINAL ANCILLARY ONLY
Bacterial Vaginitis (gardnerella): POSITIVE — AB
Candida Glabrata: NEGATIVE
Candida Vaginitis: NEGATIVE
Chlamydia: NEGATIVE
Comment: NEGATIVE
Comment: NEGATIVE
Comment: NEGATIVE
Comment: NEGATIVE
Comment: NEGATIVE
Comment: NORMAL
Neisseria Gonorrhea: NEGATIVE
Trichomonas: NEGATIVE

## 2020-11-05 LAB — URINE CULTURE

## 2021-01-11 ENCOUNTER — Encounter: Payer: Self-pay | Admitting: Emergency Medicine

## 2021-03-30 ENCOUNTER — Emergency Department (HOSPITAL_COMMUNITY): Payer: Medicaid Other

## 2021-03-30 ENCOUNTER — Emergency Department (HOSPITAL_COMMUNITY)
Admission: EM | Admit: 2021-03-30 | Discharge: 2021-03-30 | Disposition: A | Discharge status: Home / Self Care | Payer: Medicaid Other | Attending: Emergency Medicine | Admitting: Emergency Medicine

## 2021-03-30 ENCOUNTER — Other Ambulatory Visit: Payer: Self-pay

## 2021-03-30 ENCOUNTER — Encounter (HOSPITAL_COMMUNITY): Payer: Self-pay | Admitting: *Deleted

## 2021-03-30 DIAGNOSIS — Z87891 Personal history of nicotine dependence: Secondary | ICD-10-CM | POA: Insufficient documentation

## 2021-03-30 DIAGNOSIS — M7731 Calcaneal spur, right foot: Secondary | ICD-10-CM | POA: Diagnosis not present

## 2021-03-30 DIAGNOSIS — M722 Plantar fascial fibromatosis: Secondary | ICD-10-CM | POA: Diagnosis not present

## 2021-03-30 DIAGNOSIS — M79671 Pain in right foot: Secondary | ICD-10-CM | POA: Diagnosis present

## 2021-03-30 MED ORDER — IBUPROFEN 800 MG PO TABS: 800.0000 mg | ORAL_TABLET | Freq: Three times a day (TID) | ORAL | 0 refills | Status: DC

## 2021-03-30 NOTE — ED Triage Notes (Signed)
Pt with right heel pain that a week ago and pain has moved up to right ankle.  Pt denies any injury.

## 2021-03-30 NOTE — ED Provider Notes (Signed)
[facility] EMERGENCY DEPARTMENT Provider Note   CSN: [id number] Arrival date & time: 03/30/21  1731     History Chief Complaint  Patient presents with   Foot Pain    Kaitlyn Kramer is a 35 y.o. female.   Foot Pain       Kaitlyn Kramer is a 35 y.o. female who presents to the Emergency Department complaining of right heel pain x1 week.  She describes a sharp pain that worsens upon standing or walking.  Pain at times moves up her ankle and to the arch of her foot.  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  She denies known injury, swelling, redness, or numbness of her foot.  No alleviating factors.    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    Patient Active Problem List   Diagnosis Date Noted   Right ureteral stone 05/23/2014   Pyelonephritis 05/21/2014   Sepsis (HCC) 05/21/2014   Nausea and vomiting 05/21/2014   SIRS (systemic inflammatory response syndrome) (HCC) 05/21/2014    Past Surgical History:  Procedure Laterality Date   DILATION AND CURETTAGE OF UTERUS     INCISION AND DRAINAGE ABSCESS ANAL     TUBAL LIGATION       OB History     Gravida  6   Para  4   Term  4   Preterm  0   AB  2   Living         SAB  1   IAB  1   Ectopic  0   Multiple      Live Births              Family History  Problem Relation Age of Onset   Diabetes Mother    Hyperlipidemia Mother     Social History   Tobacco Use   Smoking status: Former    Packs/day: 1.00    Years: 17.00    Pack years: 17.00    Types: Cigarettes   Smokeless tobacco: Never  Vaping Use   Vaping Use: Every day  Substance Use Topics   Alcohol use: No   Drug use: Yes    Types: Marijuana    Comment: daily     Home Medications Prior to Admission medications   Medication Sig  Start Date End Date Taking? Authorizing Provider  metroNIDAZOLE (FLAGYL) 500 MG tablet Take 1 tablet (500 mg total) by mouth 2 (two) times daily. 11/03/20   Wurst, [place], PA-C    Allergies    Naproxen and Penicillins  Review of Systems   Review of Systems  Constitutional:  Negative for chills, fatigue and fever.  Gastrointestinal:  Negative for nausea and vomiting.  Musculoskeletal:  Positive for arthralgias (Right foot pain). Negative for back pain and myalgias.  Skin:  Negative for color change, rash and wound.  Neurological:  Negative for dizziness, weakness and numbness.  Hematological:  Does not bruise/bleed easily.   Physical Exam Updated Vital Signs BP 134/82 (BP Location: Right Arm)   Pulse 73   Temp 98.3 F (36.8 C) (Oral)   Resp 14   Ht 5\' 7"  (1.702 m)   Wt 95.3 kg   LMP 03/14/2021  SpO2 100%   BMI 32.89 kg/m   Physical Exam Vitals and nursing note reviewed.  Constitutional:      General: She is not in acute distress.    Appearance: Normal appearance.  HENT:     Head: Atraumatic.  Cardiovascular:     Rate and Rhythm: Normal rate and regular rhythm.  Pulmonary:     Effort: Pulmonary effort is normal.  Musculoskeletal:        General: Tenderness present. No swelling. Normal range of motion.     Right lower leg: No edema.     Left lower leg: No edema.     Comments: Focal tenderness to palpation plantar surface of the right posterior foot.  No edema or erythema.  Achilles tendon nontender and appears intact.  Skin:    General: Skin is warm.     Capillary Refill: Capillary refill takes less than 2 seconds.     Findings: No bruising, erythema or rash.  Neurological:     General: No focal deficit present.     Mental Status: She is alert.     Sensory: No sensory deficit.     Motor: No weakness.    ED Results / Procedures / Treatments   Labs (all labs ordered are listed, but only abnormal results are displayed) Labs Reviewed - No data to  display  EKG None  Radiology DG Foot Complete Right  Result Date: 03/30/2021 CLINICAL DATA:  Right heel pain EXAM: RIGHT FOOT COMPLETE - 3+ VIEW COMPARISON:  None. FINDINGS: No fracture or malalignment. Moderate plantar calcaneal spur. Soft tissues are unremarkable. IMPRESSION: Negative.  Moderate plantar calcaneal spur Electronically Signed   By: Jasmine Pang M.D.   On: 03/30/2021 19:24    Procedures Procedures   Medications Ordered in ED Medications - No data to display  ED Course  I have reviewed the triage vital signs and the nursing notes.  Pertinent labs & imaging results that were available during my care of the patient were reviewed by me and considered in my medical decision making (see chart for details).    MDM Rules/Calculators/A&P                           Patient here with localized heel pain right foot x1 week.  No known injury. On exam, she has focal tenderness over the plantar surface of the heel.  Neurovascularly intact.  No edema or erythema.    X-ray of the foot shows a moderate plantar calcaneal spur which is likely source of patient's pain.  Discussed symptomatic treatment options with ice, elevation, and heel insert in her shoes.  Prescription written for NSAID and she is agreeable to follow-up with podiatry if needed. Patient noted to have you have allergy to naproxen but states that she tolerates ibuprofen.  Final Clinical Impression(s) / ED Diagnoses Final diagnoses:  Plantar fasciitis of right foot  Calcaneal spur of right foot    Rx / DC Orders ED Discharge Orders     None        Rosey Bath 03/30/21 2056    Bethann Berkshire, MD 04/03/21 [phone]

## 2021-03-30 NOTE — Discharge Instructions (Addendum)
Try wearing a heel insert in your shoe and make sure that your shoes fit properly.  Do not wear sandals or go barefoot.  Try rolling your foot over a cold can or bottle.  Follow-up with the foot doctor listed if your symptoms do not improve.

## 2021-04-11 ENCOUNTER — Encounter: Payer: Self-pay | Admitting: Podiatry

## 2021-04-11 ENCOUNTER — Other Ambulatory Visit: Payer: Self-pay

## 2021-04-11 ENCOUNTER — Ambulatory Visit (INDEPENDENT_AMBULATORY_CARE_PROVIDER_SITE_OTHER): Payer: Medicaid Other | Admitting: Podiatry

## 2021-04-11 DIAGNOSIS — M722 Plantar fascial fibromatosis: Secondary | ICD-10-CM | POA: Diagnosis not present

## 2021-04-11 NOTE — Patient Instructions (Signed)

## 2021-04-12 NOTE — Progress Notes (Signed)
  Subjective:  Patient ID: Kaitlyn Kramer, female    DOB: [birth year],  MRN: [id number]  Chief Complaint  Patient presents with   Plantar Fasciitis     (xray)(np) right foot bone spur/PF    35 y.o. female presents with the above complaint. History confirmed with patient.  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  She has purchased a plantar fasciitis sleeve and this has been helpful.  Objective:  Physical Exam: warm, good capillary refill, no trophic changes or ulcerative lesions, normal DP and PT pulses, and normal sensory exam. Left Foot: normal exam, no swelling, tenderness, instability; ligaments intact, full range of motion of all ankle/foot joints Right Foot: point tenderness over the heel pad and point tenderness of the mid plantar fascia  Radiographs: Multiple views x-ray of the right foot: Films from any [facility] reviewed she has a large heel spur Assessment:   1. Plantar fasciitis of right foot      Plan:  Patient was evaluated and treated and all questions answered.  Discussed the etiology and treatment options for plantar fasciitis including stretching, formal physical therapy, supportive shoegears such as a running shoe or sneaker, pre fabricated orthoses, injection therapy, and oral medications. We also discussed the role of surgical treatment of this for patients who do not improve after exhausting non-surgical treatment options.   -XR reviewed with patient -Educated patient on stretching and icing of the affected limb -Injection delivered to the plantar fascia of the right foot. -She had issues with gastric reflux previously she will take Motrin as needed for pain  After sterile prep with povidone-iodine solution and alcohol, the right heel was injected with 0.5cc 2% xylocaine plain, 0.5cc 0.5% marcaine plain, 5mg  triamcinolone acetonide, and 2mg  dexamethasone was injected along the medial plantar fascia at the insertion on the plantar  calcaneus. The patient tolerated the procedure well without complication.    Return if symptoms worsen or fail to improve, for recheck plantar fasciitis.

## 2021-07-04 ENCOUNTER — Ambulatory Visit (INDEPENDENT_AMBULATORY_CARE_PROVIDER_SITE_OTHER): Payer: Medicaid Other | Admitting: Podiatry

## 2021-07-04 ENCOUNTER — Other Ambulatory Visit: Payer: Self-pay

## 2021-07-04 DIAGNOSIS — M722 Plantar fascial fibromatosis: Secondary | ICD-10-CM | POA: Diagnosis not present

## 2021-07-04 DIAGNOSIS — M7671 Peroneal tendinitis, right leg: Secondary | ICD-10-CM | POA: Diagnosis not present

## 2021-07-04 MED ORDER — METHYLPREDNISOLONE 4 MG PO TBPK: ORAL_TABLET | ORAL | 0 refills | Status: DC

## 2021-07-05 NOTE — Progress Notes (Signed)
  Subjective:  Patient ID: Kaitlyn Kramer, female    DOB: 18-Sep-1985,  MRN: [id number]  Chief Complaint  Patient presents with   Plantar Fasciitis    right foot pain    35 y.o. female returns for follow-up with the above complaint. History confirmed with patient.  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  She has purchased a plantar fasciitis sleeve and this has been helpful.  Interval history: Has not had much improvement since last injection was helpful for a while but then wore off fairly quickly, the pain is now more lateral on the ankle and less so on the heel  Objective:  Physical Exam: warm, good capillary refill, no trophic changes or ulcerative lesions, normal DP and PT pulses, and normal sensory exam. Left Foot: normal exam, no swelling, tenderness, instability; ligaments intact, full range of motion of all ankle/foot joints Right Foot: Less pain on the plantar fascia and heel today, more on the lateral ankle and peroneal tendons  Radiographs: Multiple views x-ray of the right foot: Films from any [facility] reviewed she has a large heel spur Assessment:   1. Plantar fasciitis of right foot   2. Peroneal tendinitis of right lower extremity      Plan:  Patient was evaluated and treated and all questions answered.  Discussed the etiology and treatment options for plantar fasciitis including stretching, formal physical therapy, supportive shoegears such as a running shoe or sneaker, pre fabricated orthoses, injection therapy, and oral medications. We also discussed the role of surgical treatment of this for patients who do not improve after exhausting non-surgical treatment options.   -Recommend she begin physical therapy and I sent a referral to [facility] for this, suspect she has secondary peroneal tendinitis from her planter fasciitis -Rx for methylprednisolone taper sent to pharmacy    Return in about 6 weeks (around 08/15/2021) for recheck  plantar fasciitis, re-check peroneal tendinitis.

## 2021-09-06 ENCOUNTER — Other Ambulatory Visit (HOSPITAL_COMMUNITY)
Admission: RE | Admit: 2021-09-06 | Discharge: 2021-09-06 | Disposition: A | Discharge status: Home / Self Care | Payer: Medicaid Other | Source: Ambulatory Visit | Attending: Adult Health | Admitting: Adult Health

## 2021-09-06 ENCOUNTER — Encounter: Payer: Self-pay | Admitting: Adult Health

## 2021-09-06 ENCOUNTER — Ambulatory Visit (INDEPENDENT_AMBULATORY_CARE_PROVIDER_SITE_OTHER): Payer: Medicaid Other | Admitting: Adult Health

## 2021-09-06 ENCOUNTER — Other Ambulatory Visit: Payer: Self-pay

## 2021-09-06 VITALS — BP 99/62 | HR 63 | Ht 65.0 in | Wt 214.5 lb

## 2021-09-06 DIAGNOSIS — Z8742 Personal history of other diseases of the female genital tract: Secondary | ICD-10-CM

## 2021-09-06 DIAGNOSIS — Z0000 Encounter for general adult medical examination without abnormal findings: Secondary | ICD-10-CM | POA: Insufficient documentation

## 2021-09-06 DIAGNOSIS — Z01419 Encounter for gynecological examination (general) (routine) without abnormal findings: Secondary | ICD-10-CM

## 2021-09-06 NOTE — Progress Notes (Signed)
Patient ID: Kaitlyn Kramer, female   DOB: 1985/12/14, 36 y.o.   MRN: [id number] History of Present Illness: Kaitlyn Kramer is a 36 year old white female, married, [id number], in for well woman gyn exam and pap. No pap since 2013 in [facility], it was abnormal she said. She had her 49 and 80 year olds with [facility].    Current Medications, Allergies, Past Medical History, Past Surgical History, Family History and Social History were reviewed in [employer] record.     Review of Systems: Patient denies any headaches, hearing loss, fatigue, blurred vision, shortness of breath, chest pain, abdominal pain, problems with bowel movements, urination, or intercourse. No joint pain or mood swings.  Period was heavier in November but light in December    Physical Exam:BP 99/62 (BP Location: Left Arm, Patient Position: Sitting, Cuff Size: Large)    Pulse 63    Ht 5\' 5"  (1.651 m)    Wt 214 lb 8 oz (97.3 kg)    LMP 08/18/2021 (Approximate)    BMI 35.69 kg/m   General:  Well developed, well nourished, no acute distress Skin:  Warm and dry Neck:  Midline trachea, normal thyroid, good ROM, no lymphadenopathy Lungs; Clear to auscultation bilaterally Breast:  No dominant palpable mass, retraction, or nipple discharge Cardiovascular: Regular rate and rhythm Abdomen:  Soft, non tender, no hepatosplenomegaly Pelvic:  External genitalia is normal in appearance, no lesions.  The vagina is normal in appearance. Urethra has no lesions or masses. The cervix is bulbous.,has several white areas on cervix at 7-10 0' clock, pap with GC/CHL and HR HPV genotyping performed.   Uterus is felt to be normal size, shape, and contour.  No adnexal masses or tenderness noted.Bladder is non tender, no masses felt. Rectal: Deferred Extremities/musculoskeletal:  No swelling or varicosities noted, no clubbing or cyanosis Psych:  No mood changes, alert and cooperative,seems happy AA is 4 Fall risk is low Depression screen PHQ 2/9  09/06/2021  Decreased Interest 0  Down, Depressed, Hopeless 0  PHQ - 2 Score 0  Altered sleeping 1  Tired, decreased energy 2  Change in appetite 1  Feeling bad or failure about yourself  0  Trouble concentrating 0  Moving slowly or fidgety/restless 0  Suicidal thoughts 0  PHQ-9 Score 4    GAD 7 : Generalized Anxiety Score 09/06/2021  Nervous, Anxious, on Edge 1  Control/stop worrying 0  Worry too much - different things 0  Trouble relaxing 0  Restless 0  Easily annoyed or irritable 1  Afraid - awful might happen 0  Total GAD 7 Score 2      Upstream - 09/06/21 1447       Pregnancy Intention Screening   Does the patient want to become pregnant in the next year? No    Does the patient's partner want to become pregnant in the next year? No    Would the patient like to discuss contraceptive options today? No      Contraception Wrap Up   Current Method Female Sterilization    End Method Female Sterilization    Contraception Counseling Provided No            Examination chaperoned by 11/04/21 LPN   Impression and plan: 1. Routine general medical examination at a health care facility Pap sent   2. History of abnormal cervical Pap smear   3. Encounter for gynecological examination with Papanicolaou smear of cervix Pap sent Physical in 1 year Pap  in 3 if normal

## 2021-09-12 LAB — CYTOLOGY - PAP
Chlamydia: NEGATIVE
Comment: NEGATIVE
Comment: NEGATIVE
Comment: NORMAL
Diagnosis: UNDETERMINED — AB
High risk HPV: NEGATIVE
Neisseria Gonorrhea: NEGATIVE

## 2021-09-13 ENCOUNTER — Encounter: Payer: Self-pay | Admitting: Adult Health

## 2021-09-13 DIAGNOSIS — R87610 Atypical squamous cells of undetermined significance on cytologic smear of cervix (ASC-US): Secondary | ICD-10-CM | POA: Insufficient documentation

## 2022-04-12 ENCOUNTER — Telehealth: Payer: Self-pay | Admitting: Orthopaedic Surgery

## 2022-04-12 NOTE — Telephone Encounter (Signed)
Patient called to relay she was treated at [facility] emergency room; states was told she has a fractured foot. Discussed what is needed for treating here, including provider's notes, Xray report, and Xray CD. Patient also asked about [place] plan that she has ([employer]) - discussed in network with [facility]. Patient also mentioned not having ever been seen by primary care on card, [facility]. Will make calls for all discussed and will call back if needs appointment.

## 2022-04-17 ENCOUNTER — Ambulatory Visit (INDEPENDENT_AMBULATORY_CARE_PROVIDER_SITE_OTHER): Payer: Medicaid Other | Admitting: Podiatry

## 2022-04-17 ENCOUNTER — Ambulatory Visit (INDEPENDENT_AMBULATORY_CARE_PROVIDER_SITE_OTHER): Payer: Medicaid Other

## 2022-04-17 DIAGNOSIS — S92354A Nondisplaced fracture of fifth metatarsal bone, right foot, initial encounter for closed fracture: Secondary | ICD-10-CM

## 2022-04-17 DIAGNOSIS — S92351A Displaced fracture of fifth metatarsal bone, right foot, initial encounter for closed fracture: Secondary | ICD-10-CM | POA: Diagnosis not present

## 2022-04-17 NOTE — Progress Notes (Unsigned)
  Subjective:  Patient ID: Kaitlyn Kramer, female    DOB: 12/14/1985,  MRN: [id number]  Chief Complaint  Patient presents with   Foot Pain    "I broke my 5th metatarsal." N - broke metatarsal L - 5th metatarsal rt D - Tuesday night O - suddenly, fell down a stair C - bruised, sharp pain, tender A - walking, standing, shoe T - ER, surgical shoe, hydrocodone 5/325, ace bandage, ice     36 y.o. female presents with the above complaint. History confirmed with patient.   Objective:  Physical Exam: warm, good capillary refill, no trophic changes or ulcerative lesions, normal DP and PT pulses, normal sensory exam, and pain ecchymosis on lateral foot at fifth metatarsal base, not in ankle, no navicular or medial ankle.  Radiographs: Multiple views x-ray of the right foot: Displaced intra-articular fifth metatarsal base fracture Assessment:   1. Closed nondisplaced fracture of fifth metatarsal bone of right foot, initial encounter      Plan:  Patient was evaluated and treated and all questions answered.  We reviewed her radiographs together.  I discussed with her that typically fractures in this zone when they are nondisplaced often heal without surgical intervention but with the amount of displacement and intra-articular extension and rotation I recommend surgical intervention to realign the fracture fragments and increase chance of healing and accelerate her recovery with internal fixation.  We discussed the risk benefits and potential complications of surgery including but not limited to pain, swelling, infection, scar, numbness which may be temporary or permanent, chronic pain, stiffness, nerve pain or damage, wound healing problems, bone healing problems including delayed or non-union.  She is in a surgical shoe, recommend to minimize weightbearing at this point until surgery.,  Unfortunately would not be able to address this surgically in a reasonable timeframe as I am going to be out of  town next week.  I discussed her case with Dr. Allena Katz my partner who will be taking her to surgery next week.  She will see him for preoperative consult, surgical consent was obtained today under his name.  No follow-ups on file.

## 2022-04-19 ENCOUNTER — Telehealth: Payer: Self-pay

## 2022-04-19 NOTE — Telephone Encounter (Signed)
DOS 04/24/2022  OPEN REPAIR METATARSAL FX 5TH RT - [phone]  [employer] MEDICAID  NOTIFICATION/PRIOR AUTHORIZATION NUMBER CASE STATUS CASE STATUS REASON PRIMARY CARE PHYSICIAN [id number] Closed Case Was Managed And Is Now Complete - ADVANCE NOTIFY DATE/TIME ADMISSION NOTIFY DATE/TIME 04/18/2022 12:49 PM CDT - COVERAGE STATUS OVERALL COVERAGE STATUS Covered/Approved 1-1 CODE DESCRIPTION COVERAGE STATUS DECISION DATE FAC [facility] Coverage determination is reflected for the facility admission and is not a guarantee of payment for ongoing services. Covered/Approved 04/18/2022 1 [id number] Open treatment of metatarsal fracture, i more Covered/Approved 04/18/2022

## 2022-04-20 ENCOUNTER — Ambulatory Visit: Payer: Medicaid Other | Admitting: Podiatry

## 2022-04-20 ENCOUNTER — Telehealth: Payer: Self-pay | Admitting: Podiatry

## 2022-04-20 DIAGNOSIS — S92351A Displaced fracture of fifth metatarsal bone, right foot, initial encounter for closed fracture: Secondary | ICD-10-CM | POA: Diagnosis not present

## 2022-04-20 DIAGNOSIS — Z01818 Encounter for other preprocedural examination: Secondary | ICD-10-CM

## 2022-04-20 NOTE — Telephone Encounter (Signed)
Pt called and has an appt today at 1015 and has overslept she has to be seen today as she has surgery on Monday per [facility]. I told pt to come on in and we will work her in. She has to sign consent for surgery.

## 2022-04-24 ENCOUNTER — Other Ambulatory Visit: Payer: Self-pay | Admitting: Podiatry

## 2022-04-24 ENCOUNTER — Encounter: Payer: Self-pay | Admitting: Podiatry

## 2022-04-24 DIAGNOSIS — S92351A Displaced fracture of fifth metatarsal bone, right foot, initial encounter for closed fracture: Secondary | ICD-10-CM | POA: Diagnosis not present

## 2022-04-24 MED ORDER — OXYCODONE-ACETAMINOPHEN 5-325 MG PO TABS
1.0000 | ORAL_TABLET | ORAL | 0 refills | Status: DC | PRN
Start: 2022-04-24 — End: 2022-05-23

## 2022-04-24 MED ORDER — OXYCODONE-ACETAMINOPHEN 5-325 MG PO TABS: 1.0000 | ORAL_TABLET | ORAL | 0 refills | Status: DC | PRN

## 2022-04-27 NOTE — Progress Notes (Signed)
  Subjective:  Patient ID: CING [place], female    DOB: [birth year],  MRN: [id number]  Chief Complaint  Patient presents with   Fracture    36 y.o. female presents with the above complaint. History confirmed with patient.  She is coming in with a right fifth metatarsal fracture base treated by Dr. Lilian Kapur and needs surgery.  Objective:  Physical Exam: warm, good capillary refill, no trophic changes or ulcerative lesions, normal DP and PT pulses, normal sensory exam, and pain ecchymosis on lateral foot at fifth metatarsal base, not in ankle, no navicular or medial ankle.  Radiographs: Multiple views x-ray of the right foot: Displaced intra-articular fifth metatarsal base fracture Assessment:   1. Closed displaced fracture of fifth metatarsal bone of right foot, initial encounter   2. Encounter for preoperative examination for general surgical procedure       Plan:  Patient was evaluated and treated and all questions answered.  We reviewed her radiographs together.  I discussed with her that typically fractures in this zone when they are nondisplaced often heal without surgical intervention but with the amount of displacement and intra-articular extension and rotation I recommend surgical intervention to realign the fracture fragments and increase chance of healing and accelerate her recovery with internal fixation.  We discussed the risk benefits and potential complications of surgery including but not limited to pain, swelling, infection, scar, numbness which may be temporary or permanent, chronic pain, stiffness, nerve pain or damage, wound healing problems, bone healing problems including delayed or non-union.  She will be nonweightbearing to the right lower extremity with a cam boot on.  She will obtain crutches or walker to be nonweightbearing.  I discussed with her that prepost intraoperative plan extensive detail she states that she would like to proceed with surgery.  No follow-ups  on file.

## 2022-05-02 ENCOUNTER — Ambulatory Visit (INDEPENDENT_AMBULATORY_CARE_PROVIDER_SITE_OTHER): Payer: Medicaid Other

## 2022-05-02 ENCOUNTER — Ambulatory Visit (INDEPENDENT_AMBULATORY_CARE_PROVIDER_SITE_OTHER): Payer: Medicaid Other | Admitting: Podiatry

## 2022-05-02 ENCOUNTER — Telehealth: Payer: Self-pay | Admitting: *Deleted

## 2022-05-02 ENCOUNTER — Telehealth: Payer: Self-pay | Admitting: Podiatry

## 2022-05-02 DIAGNOSIS — S92351A Displaced fracture of fifth metatarsal bone, right foot, initial encounter for closed fracture: Secondary | ICD-10-CM

## 2022-05-02 DIAGNOSIS — Z9889 Other specified postprocedural states: Secondary | ICD-10-CM

## 2022-05-02 NOTE — Telephone Encounter (Signed)
Patient called stating that she saw Dr. Allena Katz today and she got home today and she took the boot off and the foot starting more. Advised her to loosen the ace bandage, ice/elevate the foot. When she puts the boot back on to not tighten the straps. If not improvement, recommended to call back.   No falls or injuries.

## 2022-05-02 NOTE — Telephone Encounter (Signed)
Patient is calling to request something for pain but not as strong as previous medication. Please advise.

## 2022-05-03 ENCOUNTER — Other Ambulatory Visit: Payer: Self-pay | Admitting: Podiatry

## 2022-05-03 MED ORDER — ACETAMINOPHEN-CODEINE 300-30 MG PO TABS: 1.0000 | ORAL_TABLET | ORAL | 0 refills | Status: DC | PRN

## 2022-05-09 NOTE — Progress Notes (Signed)
Subjective:  Patient ID: Kaitlyn Kramer, female    DOB: [birth year],  MRN: [id number]  No chief complaint on file.   DOS: 04/24/2022 Procedure: Right open reduction internal fixation of fifth metatarsal bone  36 y.o. female returns for post-op check.  Patient states she is doing okay.  She states her foot was plantarflexed and inverted in the boot majority of the time.  She states that she did not know if she was supposed to get it all the way down into the heel.  She states she is try not to put any weight on the foot.  She denies any other acute complaints.  Review of Systems: Negative except as noted in the HPI. Denies N/V/F/Ch.  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    Current Outpatient Medications:    acetaminophen-codeine (TYLENOL #3) 300-30 MG tablet, Take 1-2 tablets by mouth every 4 (four) hours as needed for moderate pain., Disp: 30 tablet, Rfl: 0   oxyCODONE-acetaminophen (PERCOCET) 5-325 MG tablet, Take 1 tablet by mouth every 4 (four) hours as needed for severe pain., Disp: 30 tablet, Rfl: 0   oxyCODONE-acetaminophen (PERCOCET) 5-325 MG tablet, Take 1 tablet by mouth every 4 (four) hours as needed for severe pain., Disp: 30 tablet, Rfl: 0   POTASSIUM PO, Take by mouth., Disp: , Rfl:   Social History   Tobacco Use  Smoking Status Former   Packs/day: 1.00   Years: 17.00   Total pack years: 17.00   Types: Cigarettes  Smokeless Tobacco Never    Allergies  Allergen Reactions   Naproxen Hives    Tolerates Ibuprofen   Penicillins Hives    Has patient had a PCN reaction causing immediate rash, facial/tongue/throat swelling, SOB or lightheadedness with hypotension: no  Has patient had a PCN reaction causing severe rash involving mucus membranes or skin necrosis: No Has patient had a PCN reaction that  required hospitalization: No Has patient had a PCN reaction occurring within the last 10 years: No If all of the above answers are "NO", then may proceed with Cephalosporin use.     ALL CILLINS!!!   Objective:  There were no vitals filed for this visit. There is no height or weight on file to calculate BMI. Constitutional Well developed. Well nourished.  Vascular Foot warm and well perfused. Capillary refill normal to all digits.   Neurologic Normal speech. Oriented to person, place, and time. Epicritic sensation to light touch grossly present bilaterally.  Dermatologic Skin healing well without signs of infection. Skin edges well coapted without signs of infection.  Orthopedic: Tenderness to palpation noted about the surgical site.   Radiographs:3 views of skeletally mature the right foot: Failure of hardware noted likely due to the foot being in plantarflexed inverted position.  There is slight more gapping into the fracture site.  On lateral view the fracture is still being held. Assessment:   1. Closed displaced fracture of fifth metatarsal bone of right foot, initial encounter   2. Status post foot surgery    Plan:  Patient was evaluated and treated and all questions answered.  S/p foot surgery right -Progressing as expected post-operatively. -XR: See above -WB Status: Aggressive nonweightbearing to the right lower extremity -Sutures: Intact.  No signs of Deis is  noted.  No complication noted. -Medications: None -I extensively discussed with the patient that the foot should not be on plantarflexed inverted position.  And absolutely reinforced the idea of not putting any weight down on the foot.  She states understanding will try to keep the foot and the heel in the boot better. -I believe at this time we will continue clinically monitoring the screw pullout and if it continues to worsen patient may need revisional surgery in the future. -For now patient benefit from bone  stimulator bone stimulator was ordered. -  No follow-ups on file.

## 2022-05-16 ENCOUNTER — Encounter: Payer: Self-pay | Admitting: Podiatry

## 2022-05-16 ENCOUNTER — Ambulatory Visit (INDEPENDENT_AMBULATORY_CARE_PROVIDER_SITE_OTHER): Payer: Medicaid Other | Admitting: Podiatry

## 2022-05-16 DIAGNOSIS — S92351A Displaced fracture of fifth metatarsal bone, right foot, initial encounter for closed fracture: Secondary | ICD-10-CM

## 2022-05-16 DIAGNOSIS — Z9889 Other specified postprocedural states: Secondary | ICD-10-CM

## 2022-05-16 NOTE — Progress Notes (Signed)
Subjective:  Patient ID: Kaitlyn Kramer, female    DOB: 1986/08/12,  MRN: [id number]  Chief Complaint  Patient presents with   Routine Post Op    POV #2 DOS 04/24/2022 OPEN REPAIR OF 5TH METATARSAL FRACTURE    DOS: 04/24/2022 Procedure: Right open reduction internal fixation of fifth metatarsal bone  36 y.o. female returns for post-op check.  Patient states she is doing okay.  She states her foot was plantarflexed and inverted in the boot majority of the time.  She states that she did not know if she was supposed to get it all the way down into the heel.  She states she is try not to put any weight on the foot.  She denies any other acute complaints.  Review of Systems: Negative except as noted in the HPI. Denies N/V/F/Ch.  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    Current Outpatient Medications:    acetaminophen-codeine (TYLENOL #3) 300-30 MG tablet, Take 1-2 tablets by mouth every 4 (four) hours as needed for moderate pain., Disp: 30 tablet, Rfl: 0   oxyCODONE-acetaminophen (PERCOCET) 5-325 MG tablet, Take 1 tablet by mouth every 4 (four) hours as needed for severe pain., Disp: 30 tablet, Rfl: 0   oxyCODONE-acetaminophen (PERCOCET) 5-325 MG tablet, Take 1 tablet by mouth every 4 (four) hours as needed for severe pain., Disp: 30 tablet, Rfl: 0   POTASSIUM PO, Take by mouth., Disp: , Rfl:   Social History   Tobacco Use  Smoking Status Former   Packs/day: 1.00   Years: 17.00   Total pack years: 17.00   Types: Cigarettes  Smokeless Tobacco Never    Allergies  Allergen Reactions   Naproxen Hives    Tolerates Ibuprofen   Penicillins Hives    Has patient had a PCN reaction causing immediate rash, facial/tongue/throat swelling, SOB or lightheadedness with hypotension: no  Has patient had a PCN reaction causing  severe rash involving mucus membranes or skin necrosis: No Has patient had a PCN reaction that required hospitalization: No Has patient had a PCN reaction occurring within the last 10 years: No If all of the above answers are "NO", then may proceed with Cephalosporin use.     ALL CILLINS!!!   Objective:  There were no vitals filed for this visit. There is no height or weight on file to calculate BMI. Constitutional Well developed. Well nourished.  Vascular Foot warm and well perfused. Capillary refill normal to all digits.   Neurologic Normal speech. Oriented to person, place, and time. Epicritic sensation to light touch grossly present bilaterally.  Dermatologic Skin has completely reepithelialized.  No signs of Deis is noted no complication noted.  Orthopedic: Tenderness to palpation noted about the surgical site.   Radiographs:3 views of skeletally mature the right foot: Failure of hardware noted likely due to the foot being in plantarflexed inverted position.  There is slight more gapping into the fracture site.  On lateral view the fracture is still being held. Assessment:   1. Closed displaced fracture of fifth metatarsal bone of right foot, initial encounter   2. Status post foot surgery     Plan:  Patient was evaluated and treated and all questions answered.  S/p foot surgery right -Progressing as expected post-operatively. -  XR: See above -WB Status: Aggressive nonweightbearing to the right lower extremity -Sutures: Removed.  No signs of Deis is noted.  No complication noted. -Medications: None -I extensively discussed with the patient that the foot should not be on plantarflexed inverted position.  And absolutely reinforced the idea of not putting any weight down on the foot.  She states understanding will try to keep the foot and the heel in the boot better. -I believe at this time we will continue clinically monitoring the screw pullout and if it continues to worsen  patient may need revisional surgery in the future. -Continue using bone stimulator -  No follow-ups on file.

## 2022-05-23 ENCOUNTER — Encounter: Payer: Self-pay | Admitting: Adult Health

## 2022-05-23 ENCOUNTER — Other Ambulatory Visit (HOSPITAL_COMMUNITY)
Admission: RE | Admit: 2022-05-23 | Discharge: 2022-05-23 | Disposition: A | Discharge status: Home / Self Care | Payer: Medicaid Other | Source: Ambulatory Visit | Attending: Obstetrics & Gynecology | Admitting: Obstetrics & Gynecology

## 2022-05-23 ENCOUNTER — Ambulatory Visit (INDEPENDENT_AMBULATORY_CARE_PROVIDER_SITE_OTHER): Payer: Medicaid Other | Admitting: Women's Health

## 2022-05-23 VITALS — BP 138/77 | HR 57 | Ht 67.0 in | Wt 209.0 lb

## 2022-05-23 DIAGNOSIS — N898 Other specified noninflammatory disorders of vagina: Secondary | ICD-10-CM

## 2022-05-23 NOTE — Progress Notes (Signed)
   GYN VISIT Patient name: Kaitlyn Kramer MRN [id number]  Date of birth: 06/28/1986 Chief Complaint:   vaginal itching, swelling (Recent surgery. Also took over the counter Vagisil 3)  History of Present Illness:   Kaitlyn Kramer is a 36 y.o. [phone] Caucasian female being seen today for report of vaginal itching/swelling and discharge, used vagisil 3, helped w/ itching, but still feels off. No odor.      Patient's last menstrual period was 05/22/2022. The current method of family planning is tubal ligation.  Last pap 09/06/21. Results were: ASCUS w/ HRHPV negative     09/06/2021    2:37 PM  Depression screen PHQ 2/9  Decreased Interest 0  Down, Depressed, Hopeless 0  PHQ - 2 Score 0  Altered sleeping 1  Tired, decreased energy 2  Change in appetite 1  Feeling bad or failure about yourself  0  Trouble concentrating 0  Moving slowly or fidgety/restless 0  Suicidal thoughts 0  PHQ-9 Score 4        09/06/2021    2:38 PM  GAD 7 : Generalized Anxiety Score  Nervous, Anxious, on Edge 1  Control/stop worrying 0  Worry too much - different things 0  Trouble relaxing 0  Restless 0  Easily annoyed or irritable 1  Afraid - awful might happen 0  Total GAD 7 Score 2     Review of Systems:   Pertinent items are noted in HPI Denies fever/chills, dizziness, headaches, visual disturbances, fatigue, shortness of breath, chest pain, abdominal pain, vomiting, abnormal vaginal discharge/itching/odor/irritation, problems with periods, bowel movements, urination, or intercourse unless otherwise stated above.  Pertinent History Reviewed:  Reviewed past medical,surgical, social, obstetrical and family history.  Reviewed problem list, medications and allergies. Physical Assessment:   Vitals:   05/23/22 1006  BP: 138/77  Pulse: (!) 57  Weight: 209 lb (94.8 kg)  Height: 5\' 7"  (1.702 m)  Body mass index is 32.73 kg/m.       Physical Examination:   General appearance: alert, well appearing,  and in no distress  Mental status: alert, oriented to person, place, and time  Skin: warm & dry   Cardiovascular: normal heart rate noted  Respiratory: normal respiratory effort, no distress  Abdomen: soft, non-tender   Pelvic: VULVA: normal appearing vulva with no masses, tenderness or lesions, VAGINA: normal appearing vagina with normal color and discharge, no lesions, menstrual blood CERVIX: normal appearing cervix without discharge or lesions  Extremities: no edema   Chaperone: Andrez Grime    No results found for this or any previous visit (from the past 24 hour(s)).  Assessment & Plan:  1) Vaginal itching/swelling/discharge> CV swab sent  Meds: No orders of the defined types were placed in this encounter.   No orders of the defined types were placed in this encounter.   No follow-ups on file.  [place], [facility] 05/23/2022 10:17 AM

## 2022-05-24 LAB — CERVICOVAGINAL ANCILLARY ONLY
Bacterial Vaginitis (gardnerella): POSITIVE — AB
Candida Glabrata: NEGATIVE
Candida Vaginitis: NEGATIVE
Chlamydia: NEGATIVE
Comment: NEGATIVE
Comment: NEGATIVE
Comment: NEGATIVE
Comment: NEGATIVE
Comment: NEGATIVE
Comment: NORMAL
Neisseria Gonorrhea: NEGATIVE
Trichomonas: NEGATIVE

## 2022-05-26 ENCOUNTER — Encounter: Payer: Self-pay | Admitting: Women's Health

## 2022-05-29 MED ORDER — METRONIDAZOLE 500 MG PO TABS
500.0000 mg | ORAL_TABLET | Freq: Two times a day (BID) | ORAL | 0 refills | Status: DC
Start: 2022-05-29 — End: 2022-07-04

## 2022-05-29 NOTE — Addendum Note (Signed)
Addended by: Roma Schanz on: 05/29/2022 08:46 AM   Modules accepted: Orders

## 2022-06-06 ENCOUNTER — Ambulatory Visit (INDEPENDENT_AMBULATORY_CARE_PROVIDER_SITE_OTHER): Payer: Medicaid Other | Admitting: Podiatry

## 2022-06-06 ENCOUNTER — Ambulatory Visit (INDEPENDENT_AMBULATORY_CARE_PROVIDER_SITE_OTHER): Payer: Medicaid Other

## 2022-06-06 ENCOUNTER — Encounter: Payer: Self-pay | Admitting: Podiatry

## 2022-06-06 DIAGNOSIS — Z9889 Other specified postprocedural states: Secondary | ICD-10-CM

## 2022-06-06 DIAGNOSIS — S92351A Displaced fracture of fifth metatarsal bone, right foot, initial encounter for closed fracture: Secondary | ICD-10-CM

## 2022-06-06 NOTE — Progress Notes (Signed)
Subjective:  Patient ID: Kaitlyn Kramer, female    DOB: [birth year],  MRN: [id number]  Chief Complaint  Patient presents with   Routine Post Op    "It has its moments when it hurts and drives me crazy."    DOS: 04/24/2022 Procedure: Right open reduction internal fixation of fifth metatarsal bone  36 y.o. female returns for post-op check.  Patient states she is doing okay.  Patient states she is doing a little bit better minimal pain.  She wanted to discuss next treatment plan.  She has been doing Betadine wet-to-dry dressing  Review of Systems: Negative except as noted in the HPI. Denies N/V/F/Ch.  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 (Patient not taking: Reported on 05/23/2022), Disp: , Rfl:   Social History   Tobacco Use  Smoking Status Every Day   Packs/day: 1.00   Years: 17.00   Total pack years: 17.00   Types: E-cigarettes, Cigarettes   Start date: 05/29/2019   Last attempt to quit: 01/27/2019   Years since quitting: 3.3  Smokeless Tobacco Never    Allergies  Allergen Reactions   Naproxen Hives    Tolerates Ibuprofen   Penicillins Hives    Has patient had a PCN reaction causing immediate rash, facial/tongue/throat swelling, SOB or lightheadedness with hypotension: no  Has patient had a PCN reaction causing severe rash involving mucus membranes or skin necrosis: No Has patient had a PCN reaction that required hospitalization: No Has patient had a PCN reaction occurring within the last 10 years: No If all of  the above answers are "NO", then may proceed with Cephalosporin use.     ALL CILLINS!!!   Objective:  There were no vitals filed for this visit. There is no height or weight on file to calculate BMI. Constitutional Well developed. Well nourished.  Vascular Foot warm and well perfused. Capillary refill normal to all digits.   Neurologic Normal speech. Oriented to person, place, and time. Epicritic sensation to light touch grossly present bilaterally.  Dermatologic Skin has completely reepithelialized.  No signs of Deis is noted no complication noted.  Orthopedic: Tenderness to palpation noted about the surgical site.   Radiographs:3 views of skeletally mature the right foot: Fracture site appears to be more consolidating.  No acute gapping noted.  Some bridging noted.  1. Status post foot surgery     Plan:  Patient was evaluated and treated and all questions answered.  S/p foot surgery right -Incision completely epithelialized.  At this time she can begin weightbearing as tolerated in cam boot. -I believe at this time we will continue clinically monitoring the screw pullout and if it continues to worsen patient may need revisional surgery in the future. -Continue using bone stimulator -Clinically I will see you for the final time in 6 weeks.  She can start to become weightbearing as tolerate regular shoes in 4 weeks.  I discussed  with patient she states understanding  No follow-ups on file.

## 2022-06-07 ENCOUNTER — Telehealth: Payer: Self-pay | Admitting: *Deleted

## 2022-06-07 NOTE — Telephone Encounter (Signed)
Patient is calling because after stepping down to walk, worse pain than she has ever felt, should she go back to wearing boot, can something be called in for pain. Please advise

## 2022-06-08 MED ORDER — OXYCODONE-ACETAMINOPHEN 5-325 MG PO TABS
1.0000 | ORAL_TABLET | ORAL | 0 refills | Status: DC | PRN
Start: 2022-06-08 — End: 2022-07-04

## 2022-06-12 NOTE — Telephone Encounter (Signed)
Patient is doing better, as tolerated to put pressure inside the boot.

## 2022-06-20 ENCOUNTER — Telehealth: Payer: Self-pay | Admitting: *Deleted

## 2022-06-20 NOTE — Telephone Encounter (Signed)
Patient called with concerns about post surgical foot's heel, knee hurting with walking , incision painful to touch is still draining a little, went to work for 6 hours which caused more pain. She completed her pain medicines one day ago. Please advise.

## 2022-06-21 MED ORDER — OXYCODONE-ACETAMINOPHEN 5-325 MG PO TABS: 1.0000 | ORAL_TABLET | ORAL | 0 refills | Status: DC | PRN

## 2022-06-22 NOTE — Telephone Encounter (Signed)
Called patient giving information that medication has been sent to pharmacy and if needing a sooner appointment , can schedule, Will call back if needed.

## 2022-07-04 ENCOUNTER — Encounter: Payer: Self-pay | Admitting: Emergency Medicine

## 2022-07-04 ENCOUNTER — Ambulatory Visit
Admission: EM | Admit: 2022-07-04 | Discharge: 2022-07-04 | Disposition: A | Discharge status: Home / Self Care | Payer: Medicaid Other | Attending: Nurse Practitioner | Admitting: Nurse Practitioner

## 2022-07-04 DIAGNOSIS — J039 Acute tonsillitis, unspecified: Secondary | ICD-10-CM | POA: Insufficient documentation

## 2022-07-04 DIAGNOSIS — J029 Acute pharyngitis, unspecified: Secondary | ICD-10-CM | POA: Diagnosis present

## 2022-07-04 LAB — POCT MONO SCREEN (KUC): Mono, POC: NEGATIVE

## 2022-07-04 LAB — POCT RAPID STREP A (OFFICE): Rapid Strep A Screen: NEGATIVE

## 2022-07-04 NOTE — ED Provider Notes (Signed)
[facility]    CSN: [id number] Arrival date & time: 07/04/22  1403      History   Chief Complaint No chief complaint on file.   HPI Kaitlyn Kramer is a 36 y.o. female.   Patient presents for 1 day of dry cough, runny nose, postnasal drainage, sore throat, sinus pressure, bilateral ear pressure, headache, decreased appetite, fatigue, and diarrhea for the past couple of days.  She denies fever, shortness of breath or chest pain, chest or nasal congestion, sneezing, abdominal pain, nausea/vomiting, loss of taste or smell, and new rash.  Reports no known sick contacts, reports she works at [employer] and is around people without masks a lot.  Has not take anything for symptoms so far.  Patient reports recent antibiotic use-Flagyl.  Reports she is allergic to penicillins.  She has tolerated cephalosporins well in the past.    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    Patient Active Problem List   Diagnosis Date Noted   ASCUS of cervix with negative high risk HPV 09/13/2021   History of abnormal cervical Pap smear 09/06/2021   Routine general medical examination at a health care facility 09/06/2021   Encounter for gynecological examination with Papanicolaou smear of cervix 09/06/2021   Right ureteral stone 05/23/2014   Pyelonephritis 05/21/2014   Sepsis ([place]) 05/21/2014   Nausea and vomiting 05/21/2014   SIRS (systemic inflammatory response syndrome) ([place]) 05/21/2014    Past Surgical History:  Procedure Laterality Date   DILATION AND CURETTAGE OF UTERUS     INCISION AND DRAINAGE ABSCESS ANAL     open repair metarsal 5th right Right    TUBAL LIGATION      OB History     Gravida  6   Para  4   Term  4   Preterm  0   AB  2   Living  4      SAB  1   IAB  1   Ectopic  0    Multiple      Live Births               Home Medications    Prior to Admission medications   Medication Sig Start Date End Date Taking? Authorizing Provider  acetaminophen (TYLENOL) 500 MG tablet Take 500 mg by mouth every 6 (six) hours as needed.    [provider]  POTASSIUM PO Take by mouth. Patient not taking: Reported on 05/23/2022    [provider]    Family History Family History  Problem Relation Age of Onset   Hyperlipidemia Father    Diabetes Mother    Hyperlipidemia Mother    Epilepsy Sister    ADD / ADHD Son    Epilepsy Son    ADD / ADHD Son     Social History Social History   Tobacco Use   Smoking status: Every Day    Packs/day: 1.00    Years: 17.00    Total pack years: 17.00    Types: E-cigarettes, Cigarettes    Start date: 05/29/2019    Last attempt to quit: 01/27/2019    Years since quitting: 3.4   Smokeless tobacco: Never  Vaping Use   Vaping Use: Every day  Substance Use Topics   Alcohol use: Yes    Comment: occ   Drug use: Yes    Types: Marijuana    Comment: 4 times a day     Allergies   Naproxen and Penicillins   Review of Systems Review of Systems Per HPI  Physical Exam Triage Vital Signs ED Triage Vitals  Enc Vitals Group     BP 07/04/22 1407 (!) 145/78     Pulse Rate 07/04/22 1407 90     Resp 07/04/22 1407 18     Temp 07/04/22 1407 98.9 F (37.2 C)     Temp Source 07/04/22 1407 Oral     SpO2 07/04/22 1407 93 %     Weight --      Height --      Head Circumference --      Peak Flow --      Pain Score 07/04/22 1408 3     Pain Loc --      Pain Edu? --      Excl. in GC? --    No data found.  Updated Vital Signs BP (!) 145/78 (BP Location: Right Arm)   Pulse 90   Temp 98.9 F (37.2 C) (Oral)   Resp 18   LMP 06/19/2022 (Exact Date)   SpO2 93%   Visual Acuity Right Eye Distance:   Left Eye Distance:   Bilateral Distance:    Right Eye Near:   Left Eye Near:    Bilateral Near:      Physical Exam Vitals and nursing note reviewed.  Constitutional:      General: She is not in acute distress.    Appearance: She is well-developed. She is not toxic-appearing.  HENT:     Head: Normocephalic and atraumatic.     Right Ear: Tympanic membrane and ear canal normal. No drainage or swelling. No middle ear effusion. Tympanic membrane is not erythematous.     Left Ear: Tympanic membrane and ear canal normal. No drainage, swelling or tenderness.  No middle ear effusion. Tympanic membrane is not erythematous.     Nose: No congestion or rhinorrhea.     Mouth/Throat:     Mouth: Mucous membranes are moist.     Pharynx: Oropharynx is clear. Uvula midline. Posterior oropharyngeal erythema present. No oropharyngeal exudate.     Tonsils: Tonsillar exudate present. 1+ on the right. 1+ on the left.  Eyes:     General: No scleral icterus.    Extraocular Movements: Extraocular movements intact.     Right eye: Normal extraocular motion.     Left eye: Normal extraocular motion.     Pupils: Pupils are equal, round, and reactive to light.  Cardiovascular:     Rate and Rhythm: Normal rate and regular rhythm.  Pulmonary:     Effort: Pulmonary effort is normal. No respiratory distress.     Breath sounds: Normal breath sounds. No wheezing, rhonchi or rales.  Abdominal:     General: Abdomen is flat. Bowel sounds are normal. There is no distension.     Palpations: Abdomen is soft.     Tenderness: There is no abdominal tenderness.  Musculoskeletal:     Cervical back: Normal range of motion and neck supple.  Lymphadenopathy:     Cervical: No cervical adenopathy.  Skin:    General: Skin is warm and dry.     Capillary Refill: Capillary refill takes less than 2 seconds.     Coloration: Skin is not pale.  Findings: No erythema or rash.  Neurological:     Mental Status: She is alert and oriented to person, place, and time.  Psychiatric:        Behavior: Behavior is cooperative.      UC  Treatments / Results  Labs (all labs ordered are listed, but only abnormal results are displayed) Labs Reviewed  CULTURE, GROUP A STREP [facility])  POCT RAPID STREP A (OFFICE)  POCT MONO SCREEN [facility])    EKG   Radiology No results found.  Procedures Procedures (including critical care time)  Medications Ordered in UC Medications - No data to display  Initial Impression / Assessment and Plan / UC Course  I have reviewed the triage vital signs and the nursing notes.  Pertinent labs & imaging results that were available during my care of the patient were reviewed by me and considered in my medical decision making (see chart for details).   Patient is well-appearing, normotensive, afebrile, not tachycardic, not tachypneic, oxygenating well on room air.    Acute pharyngitis, unspecified etiology Acute tonsillitis, unspecified etiology Rapid strep throat test negative, throat culture pending Mononucleosis screen negative Suspect viral etiology, will throat culture prior to prescribing antibiotics Supportive care discussed Note given for work ER and return precautions discussed  The patient was given the opportunity to ask questions.  All questions answered to their satisfaction.  The patient is in agreement to this plan.    Final Clinical Impressions(s) / UC Diagnoses   Final diagnoses:  Acute pharyngitis, unspecified etiology  Acute tonsillitis, unspecified etiology     Discharge Instructions      Rapid strep throat test is negative.  Monotest is negative.  We have sent for a strep throat culture and will call you in a couple of days if this is positive.  Most likely, your sore throat is caused by a virus and should improve for the next week or so.  Some things to make you feel better: -Salt water gargling -Nasal saline rinses -Cepacol lozenges or Chloraseptic throat spray -Mucinex 600 mg twice a day as needed for congestion     ED Prescriptions   None    PDMP  not reviewed this encounter.   Valentino Nose, NP 07/04/22 1517

## 2022-07-04 NOTE — Discharge Instructions (Signed)
Rapid strep throat test is negative.  Monotest is negative.  We have sent for a strep throat culture and will call you in a couple of days if this is positive.  Most likely, your sore throat is caused by a virus and should improve for the next week or so.  Some things to make you feel better: -Salt water gargling -Nasal saline rinses -Cepacol lozenges or Chloraseptic throat spray -Mucinex 600 mg twice a day as needed for congestion

## 2022-07-04 NOTE — ED Triage Notes (Signed)
Sore throat, hurts worse on right side.  Symptoms started this morning.  Also c/o cough

## 2022-07-07 LAB — CULTURE, GROUP A STREP (THRC)

## 2022-07-10 ENCOUNTER — Telehealth (HOSPITAL_COMMUNITY): Payer: Self-pay | Admitting: Emergency Medicine

## 2022-07-10 NOTE — Telephone Encounter (Signed)
Patient had non-Group A strep on culture.  Called to check on her, per Roby, aPP.  Patient is much improved.  States she had a positive COVId test over the weekend as well.  No change to plan at this time

## 2022-07-18 ENCOUNTER — Ambulatory Visit (INDEPENDENT_AMBULATORY_CARE_PROVIDER_SITE_OTHER): Payer: Medicaid Other | Admitting: Podiatry

## 2022-07-18 ENCOUNTER — Ambulatory Visit (INDEPENDENT_AMBULATORY_CARE_PROVIDER_SITE_OTHER): Payer: Medicaid Other

## 2022-07-18 DIAGNOSIS — Z9889 Other specified postprocedural states: Secondary | ICD-10-CM

## 2022-07-18 DIAGNOSIS — S92351A Displaced fracture of fifth metatarsal bone, right foot, initial encounter for closed fracture: Secondary | ICD-10-CM

## 2022-07-18 MED ORDER — DOXYCYCLINE HYCLATE 100 MG PO TABS: 100.0000 mg | ORAL_TABLET | Freq: Two times a day (BID) | ORAL | 0 refills | Status: DC

## 2022-07-18 NOTE — Progress Notes (Signed)
Subjective:  Patient ID: Kaitlyn Kramer, female    DOB: Oct 03, 1985,  MRN: [id number]  Chief Complaint  Patient presents with   Routine Post Op    DOS: 04/24/2022 Procedure: Right open reduction internal fixation of fifth metatarsal bone  36 y.o. female returns for post-op check.  Patient states she is doing okay.  Patient states she is doing a little bit better minimal pain.  She wanted to discuss next treatment plan.  She has been doing Betadine wet-to-dry dressing  Review of Systems: Negative except as noted in the HPI. Denies N/V/F/Ch.  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 (Patient not taking: Reported on 05/23/2022), Disp: , Rfl:   Social History   Tobacco Use  Smoking Status Every Day   Packs/day: 1.00   Years: 17.00   Total pack years: 17.00   Types: E-cigarettes, Cigarettes   Start date: 05/29/2019   Last attempt to quit: 01/27/2019   Years since quitting: 3.4  Smokeless Tobacco Never    Allergies  Allergen Reactions   Naproxen Hives    Tolerates Ibuprofen   Penicillins Hives    Has patient had a PCN reaction causing immediate rash, facial/tongue/throat swelling, SOB or lightheadedness with hypotension: no  Has patient had a PCN reaction causing severe rash involving mucus membranes or skin necrosis: No Has patient had a PCN reaction that required hospitalization: No Has patient had a PCN reaction occurring within the last 10 years: No If all of the above answers are "NO", then may proceed with  Cephalosporin use.     ALL CILLINS!!!   Objective:  There were no vitals filed for this visit. There is no height or weight on file to calculate BMI. Constitutional Well developed. Well nourished.  Vascular Foot warm and well perfused. Capillary refill normal to all digits.   Neurologic Normal speech. Oriented to person, place, and time. Epicritic sensation to light touch grossly present bilaterally.  Dermatologic Superficial dehiscence noted at the incision site is noted at the incision site.  No signs of infection noted.  Orthopedic: Tenderness to palpation noted about the surgical site.   Radiographs:3 views of skeletally mature the right foot: The fracture site has consolidated.  No signs of backing or loosening noted hardware is intact.  1. Status post foot surgery     Plan:  Patient was evaluated and treated and all questions answered.  S/p foot surgery right -Skin still has some dehiscence left.  I encouraged her to do Betadine wet-to-dry dressing and wear more shoes with more space on it.  She states understanding. -I believe at this time we will continue clinically monitoring the screw pullout and if it continues to worsen patient may need revisional surgery in the future. -Continue using bone stimulator -I will place her on doxycycline for 30 days for skin and soft tissue prophylaxis -Clinically I will see her back again in 4 weeks due  to unforeseen superficial dehiscence of the incision.  I encouraged her to do Betadine wet-to-dry dressing for now.  No follow-ups on file.

## 2022-08-15 ENCOUNTER — Ambulatory Visit: Payer: Medicaid Other | Admitting: Podiatry

## 2022-08-15 DIAGNOSIS — M722 Plantar fascial fibromatosis: Secondary | ICD-10-CM | POA: Diagnosis not present

## 2022-08-22 NOTE — Progress Notes (Signed)
Subjective:  Patient ID: Kaitlyn Kramer, female    DOB: 1985-10-03,  MRN: [id number]  Chief Complaint  Patient presents with   Follow-up    Follow-up 5th met right.patient states that she has pain off and on.    36 y.o. female presents with the above complaint.  Patient now presents with right heel pain that has been going for quite some time is progressive gotten worse.  She states the fracture site is doing good denies any other acute complaints.  The heel pain has been causing her more pain and came out of nowhere sharp shooting in nature hurts with ambulation hurts with pressure pain scale 7 out of 10.   Review of Systems: Negative except as noted in the HPI. Denies N/V/F/Ch.  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    Current Outpatient Medications:    acetaminophen (TYLENOL) 500 MG tablet, Take 500 mg by mouth every 6 (six) hours as needed., Disp: , Rfl:    POTASSIUM PO, Take by mouth., Disp: , Rfl:   Social History   Tobacco Use  Smoking Status Every Day   Packs/day: 1.00   Years: 17.00   Total pack years: 17.00   Types: E-cigarettes, Cigarettes   Start date: 05/29/2019   Last attempt to quit: 01/27/2019   Years since quitting: 3.5  Smokeless Tobacco Never    Allergies  Allergen Reactions   Naproxen Hives    Tolerates Ibuprofen   Penicillins Hives    Has patient had a PCN reaction causing immediate rash, facial/tongue/throat swelling, SOB or lightheadedness with hypotension: no  Has patient had a PCN reaction causing severe rash involving mucus membranes or skin necrosis: No Has patient had a PCN reaction that required hospitalization: No Has patient had a PCN reaction occurring within the last 10 years: No If all of the above answers are "NO", then may proceed with Cephalosporin use.     ALL  CILLINS!!!   Objective:  There were no vitals filed for this visit. There is no height or weight on file to calculate BMI. Constitutional Well developed. Well nourished.  Vascular Dorsalis pedis pulses palpable bilaterally. Posterior tibial pulses palpable bilaterally. Capillary refill normal to all digits.  No cyanosis or clubbing noted. Pedal hair growth normal.  Neurologic Normal speech. Oriented to person, place, and time. Epicritic sensation to light touch grossly present bilaterally.  Dermatologic Nails well groomed and normal in appearance. No open wounds. No skin lesions.  Orthopedic: Normal joint ROM without pain or crepitus bilaterally. No visible deformities. Tender to palpation at the calcaneal tuber right. No pain with calcaneal squeeze right. Ankle ROM diminished range of motion right. Silfverskiold Test: positive right.   Radiographs: None  Assessment:   1. Plantar fasciitis of right foot    Plan:  Patient was evaluated and treated and all questions answered.  Right fifth metatarsal fracture status post ORIF of fifth metatarsal -Clinically healed and officially discharged from my care  Plantar Fasciitis, right - XR reviewed as above.  - Educated on icing and stretching. Instructions given.  - Injection delivered to the plantar fascia as below. - DME: Plantar fascial brace dispensed to support the medial longitudinal arch of the foot and offload pressure from the heel and prevent arch  collapse during weightbearing - Pharmacologic management: None  Procedure: Injection Tendon/Ligament Location: Right plantar fascia at the glabrous junction; medial approach. Skin Prep: alcohol Injectate: 0.5 cc 0.5% marcaine plain, 0.5 cc of 1% Lidocaine, 0.5 cc kenalog 10. Disposition: Patient tolerated procedure well. Injection site dressed with a band-aid.  No follow-ups on file.

## 2022-09-15 ENCOUNTER — Ambulatory Visit: Payer: Medicaid Other | Admitting: Podiatry

## 2022-10-06 ENCOUNTER — Ambulatory Visit: Payer: Medicaid Other | Admitting: Podiatry

## 2022-10-26 ENCOUNTER — Encounter: Payer: Self-pay | Admitting: Radiology

## 2023-01-09 ENCOUNTER — Ambulatory Visit (INDEPENDENT_AMBULATORY_CARE_PROVIDER_SITE_OTHER): Payer: Medicaid Other | Admitting: Adult Health

## 2023-01-09 ENCOUNTER — Encounter: Payer: Self-pay | Admitting: Adult Health

## 2023-01-09 VITALS — BP 136/81 | HR 75 | Ht 67.0 in | Wt 212.5 lb

## 2023-01-09 DIAGNOSIS — K611 Rectal abscess: Secondary | ICD-10-CM | POA: Diagnosis not present

## 2023-01-09 NOTE — Progress Notes (Addendum)
  Subjective:     Patient ID: Kaitlyn Kramer, female   DOB: [birth year], 37 y.o.   MRN: [id number]  HPI Miona is a 37 year old white female,separated, [id number], in complaining of knot in vagina, noticed Saturday and it drains into rectum and is painful to sit. She says she had bartholin cyst at 19 and perineal abscess at 23, and had surgery. She is bartender/server at [employer]  Last pap was ASCUS negative HPV 09/06/21.  Review of Systems Has knot in vagina, noticed 01/06/23, has drainage into rectum +pain with sitting Pain with tampon    Reviewed past medical,surgical, social and family history. Reviewed medications and allergies.   Objective:   Physical Exam BP 136/81 (BP Location: Left Arm, Patient Position: Sitting, Cuff Size: Normal)   Pulse 75   Ht 5\' 7"  (1.702 m)   Wt 212 lb 8 oz (96.4 kg)   LMP 01/05/2023   BMI 33.28 kg/m  Skin warm and dry.Pelvic: external genitalia is normal in appearance no lesions, vagina: has 2-3 cm tender abscess at 7 0' clock in vaginal wall, not draining now, Dr Despina Hidden in for co exam,urethra has no lesions or masses noted, cervix: bulbous, uterus: normal size, shape and contour, non tender, no masses felt, adnexa: no masses or tenderness noted. Bladder is non tender and no masses felt. On rectal exam can feel abscess not draining, has internal hemorrhoid.   Fall risk is moderate  Upstream - 01/09/23 0948       Pregnancy Intention Screening   Does the patient want to become pregnant in the next year? No    Does the patient's partner want to become pregnant in the next year? No    Would the patient like to discuss contraceptive options today? No      Contraception Wrap Up   Current Method Female Sterilization    End Method Female Sterilization            Examination chaperoned by Malachy Mood LPN  Assessment:    1. Peri-rectal abscess Recto-vaginal abscess 2-3 cm tender at 7 o'clock in vaginal wall, co exam with Dr Despina Hidden, who explained to her  what it is and what surgery needs to be done Dr Despina Hidden to schedule surgery and call her    Can take tylenol for pain Use warm compress or ice pack if needed Get donut ring to sit on  Plan:     Follow up prn

## 2023-04-11 ENCOUNTER — Encounter (HOSPITAL_COMMUNITY): Payer: Self-pay | Admitting: Emergency Medicine

## 2023-04-11 ENCOUNTER — Other Ambulatory Visit: Payer: Self-pay

## 2023-04-11 ENCOUNTER — Emergency Department (HOSPITAL_COMMUNITY)
Admission: EM | Admit: 2023-04-11 | Discharge: 2023-04-11 | Disposition: A | Discharge status: Home / Self Care | Payer: Medicaid Other | Attending: Emergency Medicine | Admitting: Emergency Medicine

## 2023-04-11 DIAGNOSIS — R1031 Right lower quadrant pain: Secondary | ICD-10-CM | POA: Diagnosis not present

## 2023-04-11 DIAGNOSIS — N939 Abnormal uterine and vaginal bleeding, unspecified: Secondary | ICD-10-CM | POA: Insufficient documentation

## 2023-04-11 DIAGNOSIS — R102 Pelvic and perineal pain: Secondary | ICD-10-CM | POA: Insufficient documentation

## 2023-04-11 DIAGNOSIS — R109 Unspecified abdominal pain: Secondary | ICD-10-CM | POA: Diagnosis present

## 2023-04-11 LAB — URINALYSIS, ROUTINE W REFLEX MICROSCOPIC
Bilirubin Urine: NEGATIVE
Glucose, UA: NEGATIVE mg/dL
Ketones, ur: NEGATIVE mg/dL
Nitrite: NEGATIVE
Protein, ur: NEGATIVE mg/dL
Specific Gravity, Urine: 1.008 (ref 1.005–1.030)
pH: 6 (ref 5.0–8.0)

## 2023-04-11 LAB — COMPREHENSIVE METABOLIC PANEL
ALT: 42 U/L (ref 0–44)
AST: 33 U/L (ref 15–41)
Albumin: 3.8 g/dL (ref 3.5–5.0)
Alkaline Phosphatase: 72 U/L (ref 38–126)
Anion gap: 7 (ref 5–15)
BUN: 11 mg/dL (ref 6–20)
CO2: 28 mmol/L (ref 22–32)
Calcium: 8.8 mg/dL — ABNORMAL LOW (ref 8.9–10.3)
Chloride: 100 mmol/L (ref 98–111)
Creatinine, Ser: 0.74 mg/dL (ref 0.44–1.00)
GFR, Estimated: >60 mL/min (ref >60)
Glucose, Bld: 112 mg/dL — ABNORMAL HIGH (ref 70–99)
Potassium: 3.6 mmol/L (ref 3.5–5.1)
Sodium: 135 mmol/L (ref 135–145)
Total Bilirubin: 0.4 mg/dL (ref 0.3–1.2)
Total Protein: 7.2 g/dL (ref 6.5–8.1)

## 2023-04-11 LAB — CBC
HCT: 41.4 % (ref 36.0–46.0)
Hemoglobin: 13.8 g/dL (ref 12.0–15.0)
MCH: 32.2 pg (ref 26.0–34.0)
MCHC: 33.3 g/dL (ref 30.0–36.0)
MCV: 96.7 fL (ref 80.0–100.0)
Platelets: 262 10*3/uL (ref 150–400)
RBC: 4.28 MIL/uL (ref 3.87–5.11)
RDW: 12.1 % (ref 11.5–15.5)
WBC: 10.9 10*3/uL — ABNORMAL HIGH (ref 4.0–10.5)
nRBC: 0 % (ref 0.0–0.2)

## 2023-04-11 LAB — WET PREP, GENITAL
Sperm: NONE SEEN
Trich, Wet Prep: NONE SEEN
WBC, Wet Prep HPF POC: <10 (ref <10)
Yeast Wet Prep HPF POC: NONE SEEN

## 2023-04-11 LAB — PREGNANCY, URINE: Preg Test, Ur: NEGATIVE

## 2023-04-11 MED ORDER — HYDROCODONE-ACETAMINOPHEN 5-325 MG PO TABS
1.0000 | ORAL_TABLET | Freq: Once | ORAL | Status: AC
  Administered 2023-04-11: 1 via ORAL
  Filled 2023-04-11: qty 1

## 2023-04-11 MED ORDER — CEPHALEXIN 500 MG PO CAPS: 500.0000 mg | ORAL_CAPSULE | Freq: Two times a day (BID) | ORAL | 0 refills | Status: DC

## 2023-04-11 MED ORDER — CEPHALEXIN 500 MG PO CAPS: 500.0000 mg | ORAL_CAPSULE | Freq: Two times a day (BID) | ORAL | 0 refills | Status: AC

## 2023-04-11 MED ORDER — HYDROCODONE-ACETAMINOPHEN 5-325 MG PO TABS: 1.0000 | ORAL_TABLET | Freq: Four times a day (QID) | ORAL | 0 refills | Status: DC | PRN

## 2023-04-11 MED ORDER — CEPHALEXIN 500 MG PO CAPS
500.0000 mg | ORAL_CAPSULE | Freq: Once | ORAL | Status: AC
  Administered 2023-04-11: 500 mg via ORAL
  Filled 2023-04-11: qty 1

## 2023-04-11 NOTE — Discharge Instructions (Addendum)
Taking care of you today.  You are being treated for UTI and lower abdominal pain.  If you develop worsening pain or develop fever or any other worsening symptoms come back to the ER.  You to follow-up with your primary care doctor or gynecology.

## 2023-04-11 NOTE — ED Notes (Signed)
Lab called to add on urine culture.  ?

## 2023-04-11 NOTE — ED Provider Notes (Signed)
[place] EMERGENCY DEPARTMENT AT [facility] Provider Note   CSN: [id number] Arrival date & time: 04/11/23  1718     History  Chief Complaint  Patient presents with   Abdominal Pain    Kaitlyn Kramer is a 37 y.o. female.  MH of stomach ulcers.  Presents the ER today complaining of pelvic pain and cramping x 1 week.  She also has been having small mild vaginal bleeding which was early for her usual menstrual period. States she had a normal period from July 27 to July 31 and then started back with cramping last week and right pelvic pain.  Denies any exacerbating leaving factors.  Pain has been constant rated 4 out of 10 and she has some right lower back pain as well.  Has history of ovarian cysts and kidney stones, states this does not feel exactly like either of those.  Denies heavy vaginal bleeding, denies dizziness, no nausea or vomiting.  She is sexually active, had a tubal ligation 11 years ago.  She notes that she primarily came in because she was worried about possible ectopic pregnancy or other emergent problem.  Abdominal Pain      Home Medications Prior to Admission medications   Medication Sig Start Date End Date Taking? Authorizing Provider  HYDROcodone-acetaminophen (NORCO) 5-325 MG tablet Take 1 tablet by mouth every 6 (six) hours as needed. 04/11/23  Yes Tavarus Poteete A, PA-C  acetaminophen (TYLENOL) 500 MG tablet Take 500 mg by mouth every 6 (six) hours as needed.    [provider]      Allergies    Naproxen and Penicillins    Review of Systems   Review of Systems  Gastrointestinal:  Positive for abdominal pain.    Physical Exam Updated Vital Signs BP 108/62 (BP Location: Left Arm)   Pulse 70   Temp 98.7 F (37.1 C) (Oral)   Resp 16   LMP 03/24/2023   SpO2 98%  Physical Exam Vitals and nursing note reviewed.  Constitutional:      General: She is not in acute distress.    Appearance: She is well-developed.  HENT:     Head:  Normocephalic and atraumatic.  Eyes:     Conjunctiva/sclera: Conjunctivae normal.  Cardiovascular:     Rate and Rhythm: Normal rate and regular rhythm.     Heart sounds: No murmur heard. Pulmonary:     Effort: Pulmonary effort is normal. No respiratory distress.     Breath sounds: Normal breath sounds.  Abdominal:     Palpations: Abdomen is soft.     Tenderness: There is abdominal tenderness in the right lower quadrant. There is no right CVA tenderness, left CVA tenderness, guarding or rebound. Negative signs include Murphy's sign and McBurney's sign.  Musculoskeletal:        General: No swelling.     Cervical back: Neck supple.  Skin:    General: Skin is warm and dry.     Capillary Refill: Capillary refill takes less than 2 seconds.  Neurological:     Mental Status: She is alert.  Psychiatric:        Mood and Affect: Mood normal.     ED Results / Procedures / Treatments   Labs (all labs ordered are listed, but only abnormal results are displayed) Labs Reviewed  COMPREHENSIVE METABOLIC PANEL - Abnormal; Notable for the following components:      Result Value   Glucose, Bld 112 (*)    Calcium 8.8 (*)  All other components within normal limits  CBC - Abnormal; Notable for the following components:   WBC 10.9 (*)    All other components within normal limits  URINALYSIS, ROUTINE W REFLEX MICROSCOPIC - Abnormal; Notable for the following components:   Color, Urine STRAW (*)    APPearance HAZY (*)    Hgb urine dipstick LARGE (*)    Leukocytes,Ua MODERATE (*)    Bacteria, UA RARE (*)    All other components within normal limits  WET PREP, GENITAL  URINE CULTURE  PREGNANCY, URINE  GC/CHLAMYDIA PROBE AMP ([place]) NOT AT [facility]    EKG None  Radiology No results found.  Procedures Procedures    Medications Ordered in ED Medications  HYDROcodone-acetaminophen (NORCO/VICODIN) 5-325 MG per tablet 1 tablet (has no administration in time range)  cephALEXin (KEFLEX)  capsule 500 mg (has no administration in time range)    ED Course/ Medical Decision Making/ A&P                                 Medical Decision Making Ddx: Pregnancy, TOA, ovarian cyst, appendicitis, kidney stone, UTI, ureterolithiasis, pyelonephritis, cholecystitis, PID, torsion, other  ED course: Patient presents for 1 week of right pelvic pain and right low back pain with some cramping and vaginal bleeding.  She is not pregnant, she is not anemic, does appear to have a UTI and is complaining of some frequency.  No CVA tenderness.  She is well-appearing with normal vital signs.  Patient initially in the hallway, discussed her we could move her to a room to do a pelvic exam, she declined this, states she mostly is reassured that her pregnancy test is negative and she can follow-up with her gynecologist.  She is going to self swab for GC chlamydia as well as a wet mount as she is not entirely certain if she has been exposed to STI but denies vaginal discharge or itching or other symptoms.  Offered CT as well to rule out renal stone or other intra-abdominal pathology including ovarian torsion or appendicitis, I think torsion or appendicitis are very unlikely given 1 week duration of symptoms and mild pain with no peritoneal signs.  Patient states she does not want to wait, she would be happy to get some pain medicine and biotics for UTI and follow-up with her primary care or gynecology.  Does not want to wait any longer, also does not want to have pelvic.  I feel this is reasonable as she has reassuring exam and vitals.  Given strict turn precautions.  Amount and/or Complexity of Data Reviewed Labs: ordered. Decision-making details documented in ED Course.  Risk Prescription drug management.           Final Clinical Impression(s) / ED Diagnoses Final diagnoses:  Right lower quadrant abdominal pain    Rx / DC Orders ED Discharge Orders          Ordered     HYDROcodone-acetaminophen (NORCO) 5-325 MG tablet  Every 6 hours PRN        04/11/23 2149              Josem Kaufmann 04/11/23 2149    Bethann Berkshire, MD 04/13/23 1052

## 2023-04-11 NOTE — ED Triage Notes (Signed)
Had normal period July 27-July 31. Started back bleeding for one day with abd cramps last week and again today to lower abd with pain worse on right side and lower right  back pain as well. Nad. Mm wet. Color wnl. Denies n/v/d/

## 2023-04-12 ENCOUNTER — Telehealth (HOSPITAL_COMMUNITY): Payer: Self-pay | Admitting: Physician Assistant

## 2023-04-12 MED ORDER — METRONIDAZOLE 500 MG PO TABS: 500.0000 mg | ORAL_TABLET | Freq: Two times a day (BID) | ORAL | 0 refills | Status: DC

## 2023-04-12 NOTE — Telephone Encounter (Cosign Needed)
Patient informed of positive clue cells, sent prescription for Flagyl to [facility].  She was agreeable with this.

## 2023-04-13 LAB — GC/CHLAMYDIA PROBE AMP (~~LOC~~) NOT AT ARMC
Chlamydia: POSITIVE — AB
Comment: NEGATIVE
Comment: NORMAL
Neisseria Gonorrhea: NEGATIVE

## 2023-04-13 LAB — URINE CULTURE

## 2023-04-16 ENCOUNTER — Telehealth (HOSPITAL_COMMUNITY): Payer: Self-pay | Admitting: Physician Assistant

## 2023-04-16 ENCOUNTER — Telehealth (HOSPITAL_COMMUNITY): Payer: Self-pay

## 2023-04-16 MED ORDER — DOXYCYCLINE HYCLATE 100 MG PO CAPS: 100.0000 mg | ORAL_CAPSULE | Freq: Two times a day (BID) | ORAL | 0 refills | Status: AC

## 2023-04-16 NOTE — Telephone Encounter (Cosign Needed)
Called to inform patient of positive GC/Chlamydia. No answer, left message to call back without including PHI. Will send doxy RX.

## 2023-04-20 ENCOUNTER — Other Ambulatory Visit (HOSPITAL_COMMUNITY)
Admission: RE | Admit: 2023-04-20 | Discharge: 2023-04-20 | Disposition: A | Discharge status: Home / Self Care | Payer: Medicaid Other | Source: Ambulatory Visit | Attending: Obstetrics & Gynecology | Admitting: Obstetrics & Gynecology

## 2023-04-20 ENCOUNTER — Encounter: Payer: Self-pay | Admitting: Obstetrics & Gynecology

## 2023-04-20 ENCOUNTER — Ambulatory Visit (INDEPENDENT_AMBULATORY_CARE_PROVIDER_SITE_OTHER): Payer: Medicaid Other | Admitting: Obstetrics & Gynecology

## 2023-04-20 ENCOUNTER — Telehealth: Payer: Self-pay

## 2023-04-20 VITALS — BP 121/77 | HR 71 | Ht 67.0 in

## 2023-04-20 DIAGNOSIS — N939 Abnormal uterine and vaginal bleeding, unspecified: Secondary | ICD-10-CM | POA: Diagnosis not present

## 2023-04-20 DIAGNOSIS — N760 Acute vaginitis: Secondary | ICD-10-CM | POA: Diagnosis present

## 2023-04-20 DIAGNOSIS — A749 Chlamydial infection, unspecified: Secondary | ICD-10-CM

## 2023-04-20 DIAGNOSIS — K611 Rectal abscess: Secondary | ICD-10-CM | POA: Diagnosis not present

## 2023-04-20 MED ORDER — FLUCONAZOLE 150 MG PO TABS
150.0000 mg | ORAL_TABLET | Freq: Once | ORAL | 3 refills | Status: AC
Start: 2023-04-20 — End: 2023-04-20

## 2023-04-20 MED ORDER — MEGESTROL ACETATE 40 MG PO TABS
ORAL_TABLET | ORAL | 0 refills | Status: AC
Start: 2023-04-20 — End: 2023-05-10

## 2023-04-20 NOTE — Telephone Encounter (Signed)
Patient stated that she was seen today and Dr. Charlotta Newton was suppose to call her in some medication for a yeast infection. But it was not called in.

## 2023-04-20 NOTE — Progress Notes (Signed)
GYN VISIT Patient name: Kaitlyn Kramer MRN [id number]  Date of birth: 02/18/1986 Chief Complaint:   bad period this month  History of Present Illness:   Kaitlyn Kramer is a 37 y.o. [id number] female being seen today for the following concerns:  AUB: Today she notes irregular bleeding x 2 weeks.  Currently filling a tampon every 1-2 hours.  It has been consistently heavy bleeding and states her period has never been like this.   Prior to this past month her periods are typically about a week and not this heavy.  Recently seen in ED- 8/14- diagnosed with Chlamydia and BV- currently undergoing treatment.  Contraception: BTL  Patient's last menstrual period was 03/24/2023.    Review of Systems:   Pertinent items are noted in HPI Denies fever/chills, dizziness, headaches, visual disturbances, fatigue, shortness of breath, chest pain, vomiting, no problems with bowel movements, urination, or intercourse unless otherwise stated above.  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 Physical Assessment:   Vitals:   04/20/23 0925  BP: 121/77  Pulse: 71  Height: 5\' 7"  (1.702 m)  Body mass index is 33.28 kg/m.       Physical Examination:   General appearance: alert, well appearing, and in no distress  Psych: mood appropriate, normal affect  Skin: warm & dry   Cardiovascular: normal heart rate noted  Respiratory: normal respiratory effort, no distress  Abdomen: soft, non-tender , no rebound, no guarding  Pelvic: VULVA: normal appearing vulva with no masses,  tenderness or lesions, VAGINA: normal appearing vagina with normal color and discharge, no lesions, currently on menses no abnormal discharge noted CERVIX: normal appearing cervix without discharge or lesions  Extremities: no edema   Chaperone: Latisha Cresenzo    Assessment & Plan:  1) AUB -suspect AUB may be due to current infection -plan for short course of Megace taper and monitor bleeding pattern -plan to track menses and should she continue to note HMB/AUB may consider starting medication (pill, LARC, etc.) []  plan to review at next annual  2) Vaginitis- currently being treated for chlamydia, BV -panel sent in to check for yeast -pt to continue treatment and plan for TOC in ~ 2wks  3) peri-rectal abscess -Improved but patient may still consider surgical intervention -Will plan to schedule preop with Dr. Despina Hidden  Meds ordered this encounter  Medications   megestrol (MEGACE) 40 MG tablet    Sig: Take 3 tablets (120 mg total) by mouth daily for 5 days, THEN 2 tablets (80 mg total) daily for 5 days, THEN 1 tablet (40 mg total) daily for 10 days.    Dispense:  35 tablet    Refill:  0   fluconazole (DIFLUCAN) 150 MG tablet    Sig: Take 1 tablet (150 mg total) by mouth once for 1 dose.    Dispense:  1 tablet    Refill:  3      Return  for 2wk RN visit (TOC) and January 2025 annual.   Myna Hidalgo, DO Attending Obstetrician & Gynecologist, [facility] for [facility], [facility]

## 2023-04-23 LAB — CERVICOVAGINAL ANCILLARY ONLY
Candida Glabrata: NEGATIVE
Candida Vaginitis: POSITIVE — AB
Comment: NEGATIVE
Comment: NEGATIVE

## 2023-04-24 ENCOUNTER — Encounter: Payer: Self-pay | Admitting: Obstetrics & Gynecology

## 2023-04-24 ENCOUNTER — Ambulatory Visit (INDEPENDENT_AMBULATORY_CARE_PROVIDER_SITE_OTHER): Payer: Medicaid Other | Admitting: Obstetrics & Gynecology

## 2023-04-24 VITALS — BP 117/73 | HR 78 | Ht 67.0 in | Wt 210.0 lb

## 2023-04-24 DIAGNOSIS — N760 Acute vaginitis: Secondary | ICD-10-CM | POA: Diagnosis not present

## 2023-04-24 NOTE — Progress Notes (Signed)
Chief Complaint  Patient presents with   Pre-op Exam    peri-rectal abscess      37 y.o. [id number] Patient's last menstrual period was 03/24/2023. The current method of family planning is tubal ligation.  Outpatient Encounter Medications as of 04/24/2023  Medication Sig   acetaminophen (TYLENOL) 500 MG tablet Take 500 mg by mouth every 6 (six) hours as needed.   megestrol (MEGACE) 40 MG tablet Take 3 tablets (120 mg total) by mouth daily for 5 days, THEN 2 tablets (80 mg total) daily for 5 days, THEN 1 tablet (40 mg total) daily for 10 days.   No facility-administered encounter medications on file as of 04/24/2023.    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    Past Surgical History:  Procedure Laterality Date   DILATION AND CURETTAGE OF UTERUS     INCISION AND DRAINAGE ABSCESS ANAL     open repair metarsal 5th right Right    TUBAL LIGATION      OB History     Gravida  6   Para  4   Term  4   Preterm  0   AB  2   Living  4      SAB  1   IAB  1   Ectopic  0   Multiple      Live Births              Allergies  Allergen Reactions   Naproxen Hives    Tolerates Ibuprofen   Penicillins Hives    Has patient had a PCN reaction causing immediate rash, facial/tongue/throat swelling, SOB or lightheadedness with hypotension: no  Has patient had a PCN reaction causing severe rash involving mucus membranes or skin necrosis: No Has patient had a PCN reaction that required hospitalization: No Has patient had a PCN reaction occurring within the last 10 years: No If all of the above answers are "NO", then may proceed with Cephalosporin use.      ALL CILLINS!!!    Social History   Socioeconomic History   Marital status: Legally Separated    Spouse name: Not on file   Number of children: Not on file   Years of education: Not on file   Highest education level: Not on file  Occupational History   Not on file  Tobacco Use   Smoking status: Former    Current packs/day: 0.00    Average packs/day: 1 pack/day for 17.0 years (17.0 ttl pk-yrs)    Types: Cigarettes    Start date: 01/26/2002    Quit date: 01/27/2019    Years since quitting: 4.2   Smokeless tobacco: Never  Vaping Use   Vaping status: Every Day  Substance and Sexual Activity   Alcohol use: Yes    Comment: occ   Drug use: Yes    Types: Marijuana    Comment: 4 times a day   Sexual activity: Yes    Birth control/protection: Surgical    Comment: tubal  Other Topics Concern   Not on file  Social History Narrative   **  Merged History Encounter **       Social Determinants of Health   Financial Resource Strain: Low Risk  (09/06/2021)   Overall Financial Resource Strain (CARDIA)    Difficulty of Paying Living Expenses: Not very hard  Food Insecurity: No Food Insecurity (09/06/2021)   Hunger Vital Sign    Worried About Running Out of Food in the Last Year: Never true    Ran Out of Food in the Last Year: Never true  Transportation Needs: No Transportation Needs (09/06/2021)   PRAPARE - Administrator, Civil Service (Medical): No    Lack of Transportation (Non-Medical): No  Physical Activity: Inactive (09/06/2021)   Exercise Vital Sign    Days of Exercise per Week: 0 days    Minutes of Exercise per Session: 0 min  Stress: No Stress Concern Present (09/06/2021)   [employer] of Occupational Health - Occupational Stress Questionnaire    Feeling of Stress : Not at all  Social Connections: Socially Isolated (09/06/2021)   Social Connection and Isolation Panel [NHANES]    Frequency of Communication with Friends and Family: Once a week    Frequency of  Social Gatherings with Friends and Family: Once a week    Attends Religious Services: Never    Database administrator or Organizations: No    Attends Engineer, structural: Never    Marital Status: Separated    Family History  Problem Relation Age of Onset   Hyperlipidemia Father    Diabetes Mother    Hyperlipidemia Mother    Epilepsy Sister    ADD / ADHD Son    Epilepsy Son    ADD / ADHD Son     Medications:       Current Outpatient Medications:    acetaminophen (TYLENOL) 500 MG tablet, Take 500 mg by mouth every 6 (six) hours as needed., Disp: , Rfl:    megestrol (MEGACE) 40 MG tablet, Take 3 tablets (120 mg total) by mouth daily for 5 days, THEN 2 tablets (80 mg total) daily for 5 days, THEN 1 tablet (40 mg total) daily for 10 days., Disp: 35 tablet, Rfl: 0  Objective Blood pressure 117/73, pulse 78, height 5\' 7"  (1.702 m), weight 210 lb (95.3 kg), last menstrual period 03/24/2023.  Left rectovaginal space mass, tender, not a Bartholin's rectal exam is negative for compression or communication  Pertinent ROS No burning with urination, frequency or urgency No nausea, vomiting or diarrhea Nor fever chills or other constitutional symptoms   Labs or studies     Impression + Management Plan: Diagnoses this Encounter::   ICD-10-CM   1. Abscess of vagina, really left lateral rectovaginal space mass/abscess/process  N76.0         Medications prescribed during  this encounter: No orders of the defined types were placed in this encounter.   Labs or Scans Ordered during this encounter: No orders of the defined types were placed in this encounter.     Follow up Return in about 5 weeks (around 06/01/2023) for Post Op, with Dr Despina Hidden.

## 2023-04-29 ENCOUNTER — Other Ambulatory Visit: Payer: Self-pay | Admitting: Podiatry

## 2023-05-04 ENCOUNTER — Ambulatory Visit: Payer: Self-pay

## 2023-05-21 ENCOUNTER — Other Ambulatory Visit (HOSPITAL_COMMUNITY): Payer: Medicaid Other

## 2023-05-23 ENCOUNTER — Ambulatory Visit: Admit: 2023-05-23 | Payer: Medicaid Other | Admitting: Obstetrics & Gynecology

## 2023-05-23 SURGERY — EXCISION, CYST, VAGINA
Anesthesia: General | Laterality: Left

## 2023-06-01 ENCOUNTER — Encounter: Payer: Medicaid Other | Admitting: Obstetrics & Gynecology

## 2024-04-18 ENCOUNTER — Encounter: Payer: Self-pay | Admitting: Radiology

## 2024-06-30 ENCOUNTER — Encounter: Payer: Self-pay | Admitting: Radiology
